# Patient Record
Sex: Male | Born: 1970 | Race: White | Hispanic: No | Marital: Single | Smoking: Current every day smoker
Health system: Southern US, Community
[De-identification: ages and names within clinical notes are randomized; demographics above are authoritative.]

## PROBLEM LIST (undated history)

## (undated) DIAGNOSIS — N200 Calculus of kidney: Secondary | ICD-10-CM

## (undated) HISTORY — PX: KNEE SURGERY: SHX244

---

## 2010-05-23 ENCOUNTER — Emergency Department (HOSPITAL_COMMUNITY): Admission: EM | Admit: 2010-05-23 | Discharge: 2010-05-23 | Payer: Self-pay | Admitting: Emergency Medicine

## 2010-05-31 ENCOUNTER — Emergency Department (HOSPITAL_COMMUNITY): Admission: EM | Admit: 2010-05-31 | Discharge: 2010-05-31 | Payer: Self-pay | Admitting: Emergency Medicine

## 2011-03-06 ENCOUNTER — Encounter: Payer: Self-pay | Admitting: *Deleted

## 2011-03-06 ENCOUNTER — Emergency Department (HOSPITAL_COMMUNITY)
Admission: EM | Admit: 2011-03-06 | Discharge: 2011-03-06 | Disposition: A | Discharge status: Home / Self Care | Payer: Self-pay | Attending: Emergency Medicine | Admitting: Emergency Medicine

## 2011-03-06 ENCOUNTER — Emergency Department (HOSPITAL_COMMUNITY): Payer: Self-pay

## 2011-03-06 DIAGNOSIS — F17200 Nicotine dependence, unspecified, uncomplicated: Secondary | ICD-10-CM | POA: Insufficient documentation

## 2011-03-06 DIAGNOSIS — S8010XA Contusion of unspecified lower leg, initial encounter: Secondary | ICD-10-CM | POA: Insufficient documentation

## 2011-03-06 DIAGNOSIS — M25579 Pain in unspecified ankle and joints of unspecified foot: Secondary | ICD-10-CM | POA: Insufficient documentation

## 2011-03-06 DIAGNOSIS — W64XXXA Exposure to other animate mechanical forces, initial encounter: Secondary | ICD-10-CM | POA: Insufficient documentation

## 2011-03-06 DIAGNOSIS — T148XXA Other injury of unspecified body region, initial encounter: Secondary | ICD-10-CM

## 2011-03-06 HISTORY — DX: Calculus of kidney: N20.0

## 2011-03-06 NOTE — ED Notes (Signed)
See triage note.

## 2011-03-06 NOTE — ED Notes (Signed)
MD at bedside. 

## 2011-03-06 NOTE — ED Notes (Signed)
Crutches fit appropriate for pt, pt able to return crutch walking without any problem
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
## 2011-03-15 NOTE — ED Provider Notes (Signed)
Medical screening examination/treatment/procedure(s) were performed by non-physician practitioner and as supervising physician I was immediately available for consultation/collaboration.   Benny Lennert, MD 03/15/11 1430

## 2013-12-25 ENCOUNTER — Encounter (HOSPITAL_COMMUNITY): Payer: Self-pay | Admitting: Emergency Medicine

## 2013-12-25 ENCOUNTER — Emergency Department (HOSPITAL_COMMUNITY): Payer: BC Managed Care – PPO

## 2013-12-25 ENCOUNTER — Emergency Department (HOSPITAL_COMMUNITY)
Admission: EM | Admit: 2013-12-25 | Discharge: 2013-12-25 | Disposition: A | Discharge status: Home / Self Care | Payer: BC Managed Care – PPO | Attending: Emergency Medicine | Admitting: Emergency Medicine

## 2013-12-25 DIAGNOSIS — W292XXA Contact with other powered household machinery, initial encounter: Secondary | ICD-10-CM | POA: Insufficient documentation

## 2013-12-25 DIAGNOSIS — F17200 Nicotine dependence, unspecified, uncomplicated: Secondary | ICD-10-CM | POA: Insufficient documentation

## 2013-12-25 DIAGNOSIS — S61419A Laceration without foreign body of unspecified hand, initial encounter: Secondary | ICD-10-CM

## 2013-12-25 DIAGNOSIS — Y9389 Activity, other specified: Secondary | ICD-10-CM | POA: Insufficient documentation

## 2013-12-25 DIAGNOSIS — Z87442 Personal history of urinary calculi: Secondary | ICD-10-CM | POA: Insufficient documentation

## 2013-12-25 DIAGNOSIS — S61209A Unspecified open wound of unspecified finger without damage to nail, initial encounter: Secondary | ICD-10-CM | POA: Insufficient documentation

## 2013-12-25 DIAGNOSIS — Y929 Unspecified place or not applicable: Secondary | ICD-10-CM | POA: Insufficient documentation

## 2013-12-25 MED ORDER — BACITRACIN-NEOMYCIN-POLYMYXIN 400-5-5000 EX OINT
TOPICAL_OINTMENT | Freq: Once | CUTANEOUS | Status: AC
  Administered 2013-12-25: 12:00:00 via TOPICAL
  Filled 2013-12-25: qty 1

## 2013-12-25 MED ORDER — POVIDONE-IODINE 10 % EX SOLN
CUTANEOUS | Status: AC
  Administered 2013-12-25: 11:00:00
  Filled 2013-12-25: qty 118

## 2013-12-25 MED ORDER — POVIDONE-IODINE 10 % EX SOLN
CUTANEOUS | Status: AC
  Filled 2013-12-25: qty 118

## 2013-12-25 MED ORDER — LIDOCAINE HCL (PF) 1 % IJ SOLN
INTRAMUSCULAR | Status: AC
  Filled 2013-12-25: qty 5

## 2013-12-25 MED ORDER — LIDOCAINE HCL (PF) 1 % IJ SOLN
5.0000 mL | Freq: Once | INTRAMUSCULAR | Status: AC
  Administered 2013-12-25: 12:00:00

## 2013-12-25 MED ORDER — CEPHALEXIN 500 MG PO CAPS: 500.0000 mg | ORAL_CAPSULE | Freq: Four times a day (QID) | ORAL | Status: DC

## 2013-12-25 NOTE — Discharge Instructions (Signed)
Return in one week for suture removal. Return sooner for redness, increased pain or signs of infection. Take the antibiotics as directed.

## 2013-12-25 NOTE — ED Notes (Signed)
Dressing applied to left hand with telfa, 4X4's, pt tolerated well,

## 2013-12-25 NOTE — ED Notes (Signed)
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

## 2013-12-25 NOTE — ED Provider Notes (Signed)
Medical screening examination/treatment/procedure(s) were performed by non-physician practitioner and as supervising physician I was immediately available for consultation/collaboration.  Flint MelterElliott L Daud Cayer, MD 12/25/13 1525

## 2013-12-25 NOTE — ED Notes (Signed)
Pt states he was cleaning grass from a lawn mower blade and cut his left ring finger on the blade

## 2013-12-25 NOTE — ED Notes (Signed)
Mixture of peroxide and NS used to soak pt's left hand,

## 2013-12-25 NOTE — ED Provider Notes (Signed)
CSN: [id number]     Arrival date & time 12/25/13  [id number] History   First MD Initiated Contact with Patient 12/25/13 1022     Chief Complaint  Patient presents with  . Extremity Laceration     (Consider location/radiation/quality/duration/timing/severity/associated sxs/prior Treatment) Patient is a 43 y.o. male presenting with skin laceration. The history is provided by the patient.  Laceration Location:  Finger Finger laceration location:  L ring finger Depth:  Through underlying tissue Quality: straight   Bleeding: controlled   Time since incident:  1 hour Laceration mechanism:  Metal edge Pain details:    Quality:  Dull   Severity:  Mild   Progression:  Unchanged Foreign body present:  No foreign bodies Relieved by:  Pressure Worsened by:  Nothing tried Tetanus status:  Up to date  Zada FindersJohn E Dorrance is a gastroenteritis male who presents to the ED with a laceration to his left ring finger. He was cleaning under his lawn mower and cut his finger on the blade. He denies any other injuries. Bleeding controlled.   Past Medical History  Diagnosis Date  . Kidney stones    Past Surgical History  Procedure Laterality Date  . Knee surgery     No family history on file. History  Substance Use Topics  . Smoking status: Current Every Day Smoker  . Smokeless tobacco: Not on file  . Alcohol Use: No    Review of Systems Negative except as stated in HPI   Allergies  Review of patient's allergies indicates no known allergies.  Home Medications   Prior to Admission medications   Not on File   BP 121/75  Pulse 88  Temp(Src) 97.2 F (36.2 C) (Oral)  Resp 18  Ht 6\' 2"  (1.88 m)  Wt 180 lb (81.647 kg)  BMI 23.10 kg/m2  SpO2 100% Physical Exam  Nursing note and vitals reviewed. Constitutional: He is oriented to person, place, and time. He appears well-developed and well-nourished. No distress.  HENT:  Head: Normocephalic.  Eyes: EOM are normal.  Neck: Neck supple.    Cardiovascular: Normal rate.   Pulmonary/Chest: Effort normal.  Musculoskeletal: Normal range of motion.       Left hand: He exhibits tenderness and laceration. He exhibits normal range of motion, no deformity and no swelling. Normal sensation noted. Decreased strength noted.       Hands: No tendon visualized. Good strength and range of motion, adequate circulation and good touch sensation.   Neurological: He is alert and oriented to person, place, and time. No cranial nerve deficit.  Skin: Skin is warm and dry.  Psychiatric: He has a normal mood and affect. His behavior is normal.    ED Course  Procedures  LACERATION REPAIR Performed by: Makayle Krahn Orlene OchM Cledith Kamiya Authorized by: Khali Perella Orlene OchM Talmage Teaster Consent: Verbal consent obtained. Risks and benefits: risks, benefits and alternatives were discussed Consent given by: patient Patient identity confirmed: provided demographic data Prepped and Draped in normal sterile fashion Wound explored  Laceration Location: left ring finger  Laceration Length: 2 cm  No Foreign Bodies seen or palpated  Anesthesia: local infiltration  Local anesthetic: lidocaine 1% without epinephrine  Anesthetic total: 3 ml  Irrigation method: syringe Amount of cleaning: standard  Skin closure: 5-0 prolene  Number of sutures: 5  Technique: interrupted  Patient tolerance: Patient tolerated the procedure well with no immediate complications.  Bacitracin ointment and dressing MDM  43 y.o. male with laceration to the left ring finger. Stable for discharge without neuro  deficits. Discussed with the patient plan of care and all questioned fully answered. He will follow up in 7 days for suture removal or sooner for any problems.    Medication List         cephALEXin 500 MG capsule  Commonly known as:  KEFLEX  Take 1 capsule (500 mg total) by mouth 4 (four) times daily.          CabanHope M Charistopher Rumble, [place]NP 12/25/13 1209

## 2013-12-25 NOTE — ED Notes (Signed)
Hope NP at bedside suturing at present time,

## 2015-11-09 ENCOUNTER — Emergency Department (HOSPITAL_COMMUNITY)
Admission: EM | Admit: 2015-11-09 | Discharge: 2015-11-10 | Disposition: A | Discharge status: Home / Self Care | Payer: BLUE CROSS/BLUE SHIELD | Attending: Emergency Medicine | Admitting: Emergency Medicine

## 2015-11-09 ENCOUNTER — Emergency Department (HOSPITAL_COMMUNITY): Payer: BLUE CROSS/BLUE SHIELD

## 2015-11-09 ENCOUNTER — Encounter (HOSPITAL_COMMUNITY): Payer: Self-pay

## 2015-11-09 DIAGNOSIS — Y9389 Activity, other specified: Secondary | ICD-10-CM | POA: Insufficient documentation

## 2015-11-09 DIAGNOSIS — F17200 Nicotine dependence, unspecified, uncomplicated: Secondary | ICD-10-CM | POA: Insufficient documentation

## 2015-11-09 DIAGNOSIS — S52514A Nondisplaced fracture of right radial styloid process, initial encounter for closed fracture: Secondary | ICD-10-CM | POA: Diagnosis not present

## 2015-11-09 DIAGNOSIS — Y999 Unspecified external cause status: Secondary | ICD-10-CM | POA: Insufficient documentation

## 2015-11-09 DIAGNOSIS — S52501A Unspecified fracture of the lower end of right radius, initial encounter for closed fracture: Secondary | ICD-10-CM

## 2015-11-09 DIAGNOSIS — S6991XA Unspecified injury of right wrist, hand and finger(s), initial encounter: Secondary | ICD-10-CM | POA: Diagnosis present

## 2015-11-09 DIAGNOSIS — W228XXA Striking against or struck by other objects, initial encounter: Secondary | ICD-10-CM | POA: Diagnosis not present

## 2015-11-09 DIAGNOSIS — Y929 Unspecified place or not applicable: Secondary | ICD-10-CM | POA: Diagnosis not present

## 2015-11-09 MED ORDER — OXYCODONE-ACETAMINOPHEN 5-325 MG PO TABS: 2.0000 | ORAL_TABLET | ORAL | Status: DC | PRN

## 2015-11-09 MED ORDER — ACETAMINOPHEN 325 MG PO TABS
650.0000 mg | ORAL_TABLET | Freq: Once | ORAL | Status: AC
  Administered 2015-11-09: 650 mg via ORAL
  Filled 2015-11-09: qty 2

## 2015-11-09 MED ORDER — IBUPROFEN 800 MG PO TABS
800.0000 mg | ORAL_TABLET | Freq: Once | ORAL | Status: AC
  Administered 2015-11-09: 800 mg via ORAL
  Filled 2015-11-09: qty 1

## 2015-11-09 MED ORDER — OXYCODONE-ACETAMINOPHEN 5-325 MG PO TABS: 1.0000 | ORAL_TABLET | Freq: Four times a day (QID) | ORAL | Status: DC | PRN
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
## 2015-11-09 NOTE — Discharge Instructions (Signed)
You have a fracture of the right radius (wrist). Please see Dr Mina Marble as soon as possible for management.Keep your wrist elevated as much as possible and apply ice. Radial Fracture A radial fracture is a break in the radius bone, which is the long bone of the forearm that is on the same side as your thumb. Your forearm is the part of your arm that is between your elbow and your wrist. It is made up of two bones: the radius and the ulna. Most radial fractures occur near the wrist (distal radialfracture) or near the elbow (radial head fracture). A distal radial fracture is the most common type of broken arm. This fracture usually occurs about an inch above the wrist. Fractures of the middle part of the bone are less common. CAUSES  Falling with your arm outstretched is the most common cause of a radial fracture. Other causes include:  Car accidents.  Bike accidents.  A direct blow to the middle part of the radius. RISK FACTORS  You may be at greater risk for a distal radial fracture if you are 50 years of age or older.  You may be at greater risk for a radial head fracture if you are:  Male.  36-55 years old.  You may be at a greater risk for all types of radial fractures if you have a condition that causes your bones to be weak or thin (osteoporosis). SIGNS AND SYMPTOMS A radial fracture causes pain immediately after the injury. Other signs and symptoms include:  An abnormal bend or bump in your arm (deformity).  Swelling.  Bruising.  Numbness or tingling.  Tenderness.  Limited movement. DIAGNOSIS  Your health care provider may diagnose a radial fracture based on:  Your symptoms.  Your medical history, including any recent injury.  A physical exam. Your health care provider will look for any deformity and feel for tenderness over the break. Your health care provider will also check whether the bone is out of place.  An X-ray exam to confirm the diagnosis and learn  more about the type of fracture. TREATMENT The goals of treatment are to get the bone in proper position for healing and to keep it from moving so it will heal over time. Your treatment will depend on many factors, especially the type of fracture that you have.  If the fractured bone:  Is in the correct position (nondisplaced), you may only need to wear a cast or a splint.  Has a slightly displaced fracture, you may need to have the bones moved back into place manually (closed reduction) before the splint or cast is put on.  You may have a temporary splint before you have a plaster cast. The splint allows room for some swelling. After a few days, a cast can replace the splint.  You may have to wear the cast for about 6 weeks or as directed by your health care provider.  The cast may be changed after about 3 weeks or as directed by your health care provider.  After your cast is taken off, you may need physical therapy to regain full movement in your wrist or elbow.  You may need emergency surgery if you have:  A fractured bone that is out of position (displaced).  A fracture with multiple fragments (comminuted fracture).  A fracture that breaks the skin (open fracture). This type of fracture may require surgical wires, plates, or screws to hold the bone in place.  You may have X-rays every  couple of weeks to check on your healing. HOME CARE INSTRUCTIONS  Keep the injured arm above the level of your heart while you are sitting or lying down. This helps to reduce swelling and pain.  Apply ice to the injured area:  Put ice in a plastic bag.  Place a towel between your skin and the bag.  Leave the ice on for 20 minutes, 2-3 times per day.  Move your fingers often to avoid stiffness and to minimize swelling.  If you have a plaster or fiberglass cast:  Do not try to scratch the skin under the cast using sharp or pointed objects.  Check the skin around the cast every day. You may  put lotion on any red or sore areas.  Keep your cast dry and clean.  If you have a plaster splint:  Wear the splint as directed.  Loosen the elastic around the splint if your fingers become numb and tingle, or if they turn cold and blue.  Do not put pressure on any part of your cast until it is fully hardened. Rest your cast only on a pillow for the first 24 hours.  Protect your cast or splint while bathing or showering, as directed by your health care provider. Do not put your cast or splint into water.  Take medicines only as directed by your health care provider.  Return to activities, such as sports, as directed by your health care provider. Ask your health care provider what activities are safe for you.  Keep all follow-up visits as directed by your health care provider. This is important. SEEK MEDICAL CARE IF:  Your pain medicine is not helping.  Your cast gets damaged or it breaks.  Your cast becomes loose.  Your cast gets wet.  You have more severe pain or swelling than you did before the cast.  You have severe pain when stretching your fingers.  You continue to have pain or stiffness in your elbow or your wrist after your cast is taken off. SEEK IMMEDIATE MEDICAL CARE IF:  You cannot move your fingers.  You lose feeling in your fingers or your hand.  Your hand or your fingers turn cold and pale or blue.  You notice a bad smell coming from your cast.  You have drainage from underneath your cast.  You have new stains from blood or drainage seeping through your cast.   This information is not intended to replace advice given to you by your health care provider. Make sure you discuss any questions you have with your health care provider.   Document Released: 01/16/2006 Document Revised: 08/26/2014 Document Reviewed: 01/28/2014 Elsevier Interactive Patient Education [employer]  elevated as much as possible.

## 2015-11-09 NOTE — ED Notes (Signed)
I had a tire blow up per pt.  Deformity noted to right forearm. Patient is able to move his right wrist, right hand, and fingers.

## 2015-11-16 MED FILL — Oxycodone w/ Acetaminophen Tab 5-325 MG: ORAL | Qty: 6 | Status: AC

## 2016-04-27 IMAGING — DX DG WRIST COMPLETE 3+V*R*
4 series · 4 of 4 positions shown · non-contrast
Comparison: None.

CLINICAL DATA: Right radial wrist pain and swelling after injury.

EXAM:
RIGHT WRIST - COMPLETE 3+ VIEW

[wrist pa]
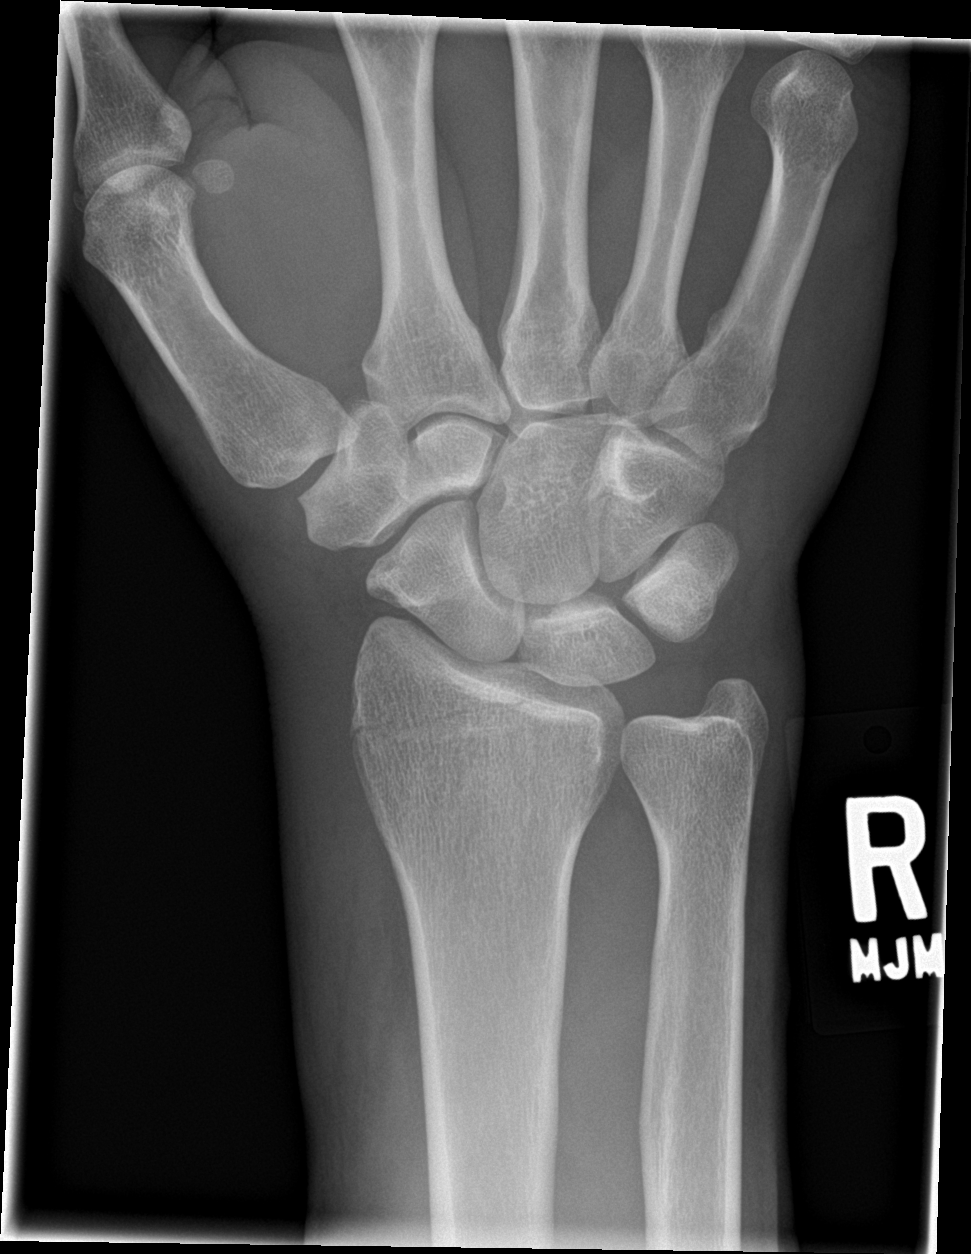

[wrist obl]
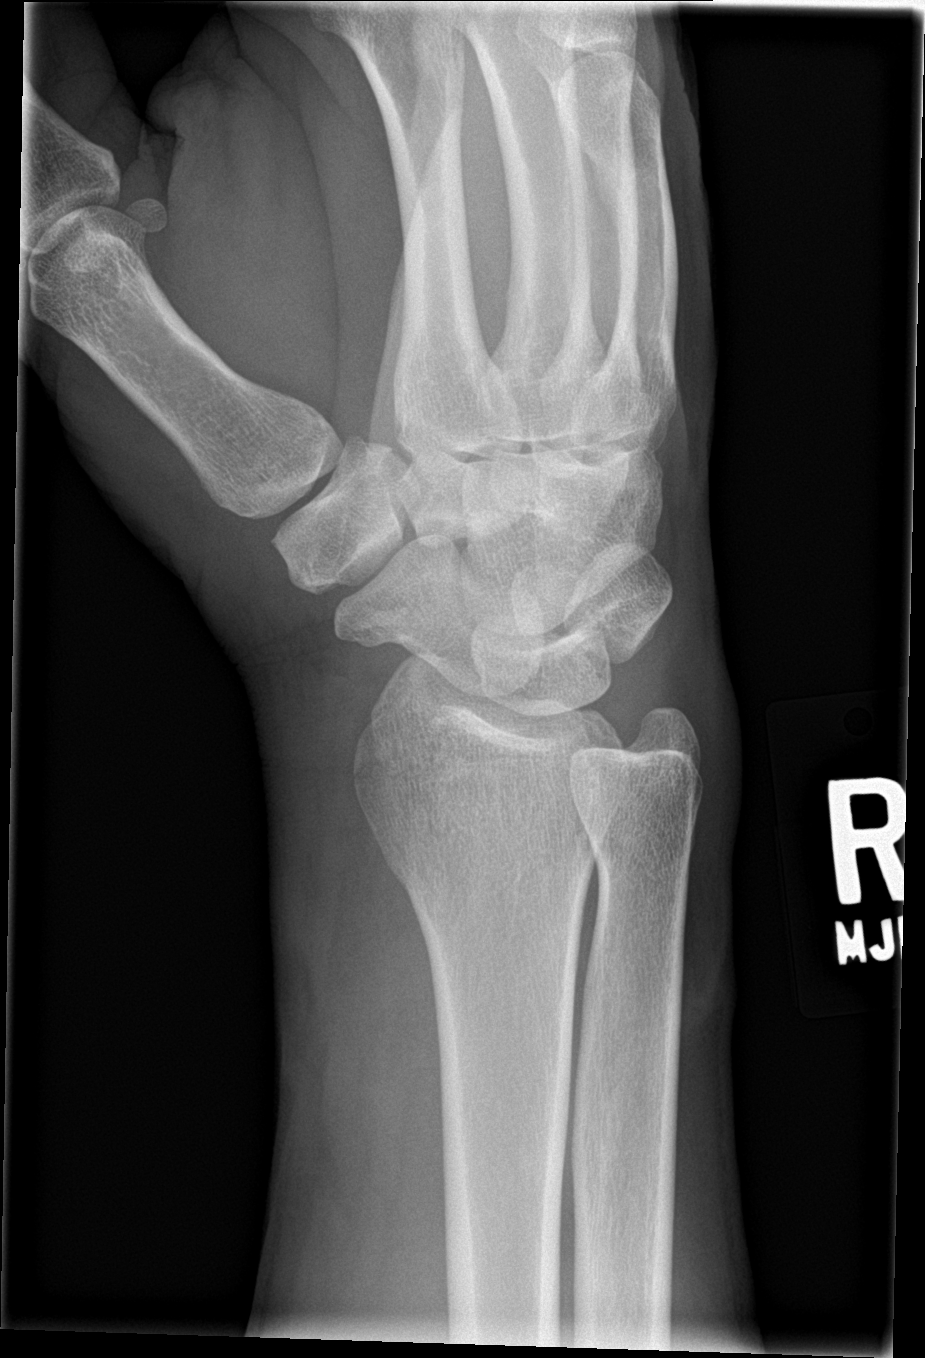

[wrist lat]
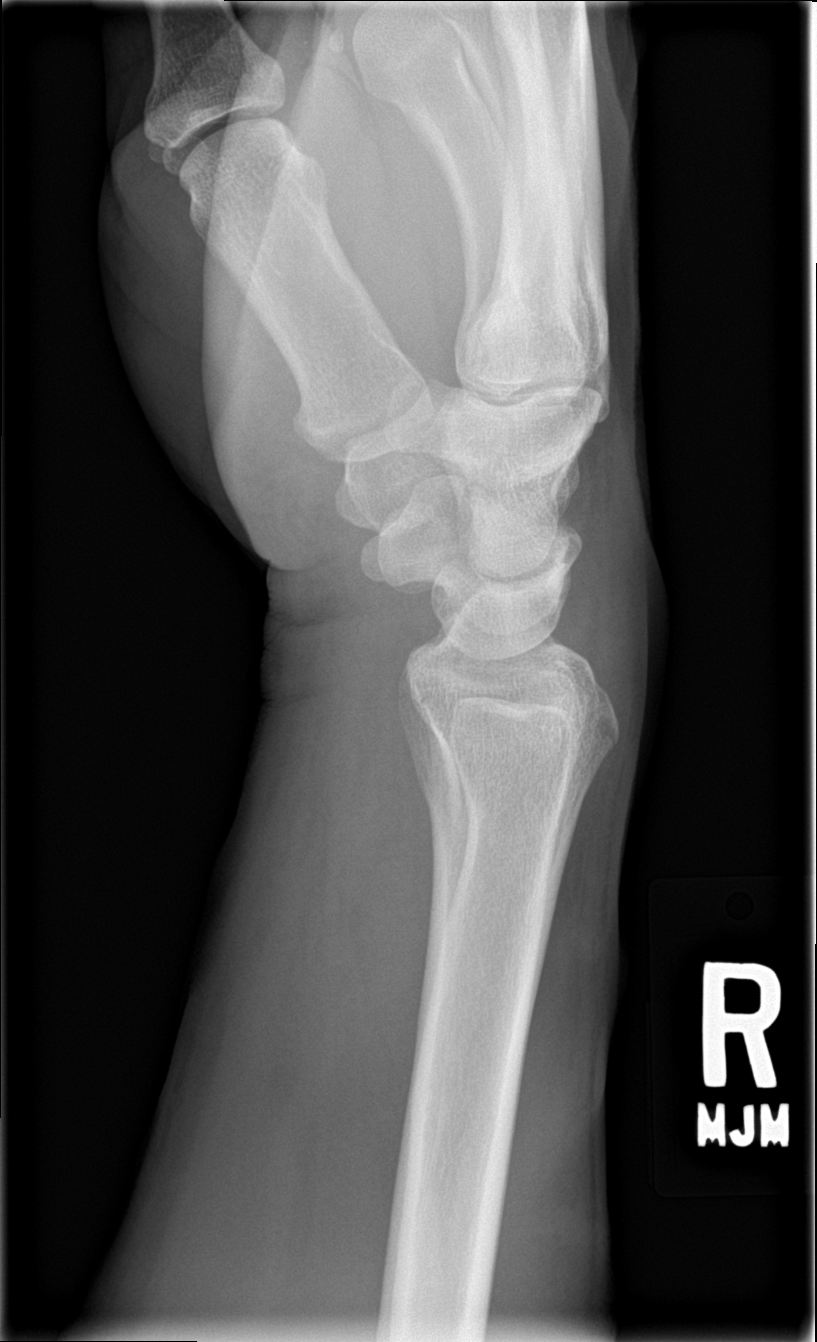

[wrist navicular]
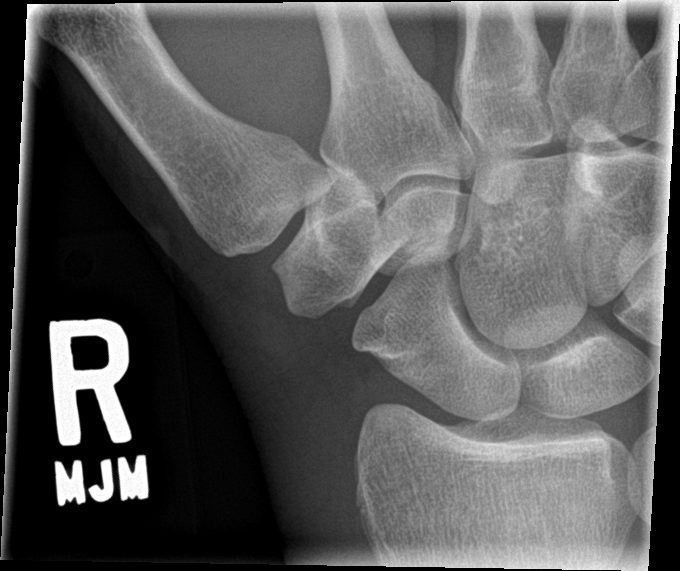

[4 of 4 positions shown; findings below may reference images not displayed]

FINDINGS: Transverse linear lucency through the distal right radius at the
base of the radial styloid process. This indicates a nondisplaced
fracture. Fracture line extends to the radiocarpal joint. No
additional fractures identified. Dorsal soft tissue swelling over
the right wrist.
IMPRESSION: Oblique linear lucency across the base of the right radial styloid
process suggesting nondisplaced acute fracture.

## 2016-07-15 ENCOUNTER — Encounter (HOSPITAL_COMMUNITY): Payer: Self-pay | Admitting: *Deleted

## 2016-07-15 ENCOUNTER — Emergency Department (HOSPITAL_COMMUNITY)
Admission: EM | Admit: 2016-07-15 | Discharge: 2016-07-15 | Payer: BLUE CROSS/BLUE SHIELD | Attending: Emergency Medicine | Admitting: Emergency Medicine

## 2016-07-15 DIAGNOSIS — F17200 Nicotine dependence, unspecified, uncomplicated: Secondary | ICD-10-CM | POA: Insufficient documentation

## 2016-07-15 DIAGNOSIS — Y929 Unspecified place or not applicable: Secondary | ICD-10-CM | POA: Insufficient documentation

## 2016-07-15 DIAGNOSIS — Y999 Unspecified external cause status: Secondary | ICD-10-CM | POA: Insufficient documentation

## 2016-07-15 DIAGNOSIS — Y939 Activity, unspecified: Secondary | ICD-10-CM | POA: Insufficient documentation

## 2016-07-15 DIAGNOSIS — T2020XA Burn of second degree of head, face, and neck, unspecified site, initial encounter: Secondary | ICD-10-CM | POA: Insufficient documentation

## 2016-07-15 DIAGNOSIS — X04XXXA Exposure to ignition of highly flammable material, initial encounter: Secondary | ICD-10-CM | POA: Insufficient documentation

## 2016-07-15 MED ORDER — HYDROMORPHONE HCL 1 MG/ML IJ SOLN
INTRAMUSCULAR | Status: AC
  Administered 2016-07-15: 1 mg via INTRAVENOUS
  Filled 2016-07-15: qty 1

## 2016-07-15 MED ORDER — HYDROMORPHONE HCL 1 MG/ML IJ SOLN
1.0000 mg | Freq: Once | INTRAMUSCULAR | Status: AC
  Administered 2016-07-15: 1 mg via INTRAVENOUS

## 2016-07-15 MED ORDER — SODIUM CHLORIDE 0.9 % IV BOLUS (SEPSIS)
1000.0000 mL | Freq: Once | INTRAVENOUS | Status: AC
Start: 2016-07-15 — End: 2016-07-15
  Administered 2016-07-15: 1000 mL via INTRAVENOUS

## 2016-07-15 MED ORDER — HYDROMORPHONE HCL 1 MG/ML IJ SOLN
1.0000 mg | Freq: Once | INTRAMUSCULAR | Status: AC
  Administered 2016-07-15: 1 mg via INTRAVENOUS
  Filled 2016-07-15: qty 1
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
## 2018-08-03 DIAGNOSIS — Z139 Encounter for screening, unspecified: Secondary | ICD-10-CM

## 2018-08-03 LAB — GLUCOSE, POCT (MANUAL RESULT ENTRY): POC Glucose: 99 mg/dl (ref 70–99)

## 2018-08-03 NOTE — Congregational Nurse Program (Signed)
  Dept: [phone]   Congregational Nurse Program Note  Date of Encounter: 08/03/2018  Past Medical History: Past Medical History:  Diagnosis Date  . Kidney stones     Encounter Details: CNP Questionnaire - 08/03/18 1425      Questionnaire   Patient Status  Not Applicable    Race  White or Caucasian    [facility] Patient [facility]    Insurance  Not Applicable    Uninsured  Uninsured (NEW 1x/quarter)    Food  Within past 12 months, worried food would run out with no money to buy more;Yes, have food insecurities    Housing/Utilities  Yes, have permanent housing    Transportation  No transportation needs    Interpersonal Safety  Yes, feel physically and emotionally safe where you currently live    Medication  No medication insecurities    Medical Provider  No    Referrals  [facility];Primary Care Provider/Clinic    ED Visit Averted  Not Applicable    Life-Saving Intervention Made  Not Applicable       Pt present to clinic with c/o lower back and leg pain with numbness.  States he has seen an urgent care provider (Dr. Margo AyeHall) on 07/27/18 and was prescribed back pain meds, but was encouraged to get an medical provider to address on going treatment and medical care.  States he has been dealing with pain since the summer of 2019  Vitals checked and all WNL; Pt described being very concerned about thinking he needs an MRI to address his ongoing back issued; Addressed wanting to quit smoking.   Educated and gave handouts on how to stay healthy and ways to stop smoking  Referred to RN Case Manager, Norval GablePatricia Gilley, to obtain appointment for PCP

## 2018-08-05 ENCOUNTER — Ambulatory Visit: Payer: Self-pay | Admitting: Physician Assistant

## 2018-08-05 ENCOUNTER — Encounter: Payer: Self-pay | Admitting: Physician Assistant

## 2018-08-05 VITALS — BP 116/70 | HR 69 | Temp 97.9°F | Ht 72.5 in | Wt 166.0 lb

## 2018-08-05 DIAGNOSIS — Z125 Encounter for screening for malignant neoplasm of prostate: Secondary | ICD-10-CM

## 2018-08-05 DIAGNOSIS — G8929 Other chronic pain: Secondary | ICD-10-CM

## 2018-08-05 DIAGNOSIS — M5442 Lumbago with sciatica, left side: Secondary | ICD-10-CM

## 2018-08-05 DIAGNOSIS — Z13220 Encounter for screening for lipoid disorders: Secondary | ICD-10-CM

## 2018-08-05 DIAGNOSIS — Z7689 Persons encountering health services in other specified circumstances: Secondary | ICD-10-CM

## 2018-08-05 DIAGNOSIS — F17200 Nicotine dependence, unspecified, uncomplicated: Secondary | ICD-10-CM

## 2018-08-05 MED ORDER — PREDNISONE 10 MG PO TABS: ORAL_TABLET | ORAL | 0 refills | Status: DC

## 2018-08-05 NOTE — Patient Instructions (Addendum)
Prednisone tablets What is this medicine? PREDNISONE (PRED ni sone) is a corticosteroid. It is commonly used to treat inflammation of the skin, joints, lungs, and other organs. Common conditions treated include asthma, allergies, and arthritis. It is also used for other conditions, such as blood disorders and diseases of the adrenal glands. This medicine may be used for other purposes; ask your health care provider or pharmacist if you have questions. COMMON BRAND NAME(S): Deltasone, Predone, Sterapred, Sterapred DS What should I tell my health care provider before I take this medicine? They need to know if you have any of these conditions: -Cushing's syndrome -diabetes -glaucoma -heart disease -high blood pressure -infection (especially a virus infection such as chickenpox, cold sores, or herpes) -kidney disease -liver disease -mental illness -myasthenia gravis -osteoporosis -seizures -stomach or intestine problems -thyroid disease -an unusual or allergic reaction to lactose, prednisone, other medicines, foods, dyes, or preservatives -pregnant or trying to get pregnant -breast-feeding How should I use this medicine? Take this medicine by mouth with a glass of water. Follow the directions on the prescription label. Take this medicine with food. If you are taking this medicine once a day, take it in the morning. Do not take more medicine than you are told to take. Do not suddenly stop taking your medicine because you may develop a severe reaction. Your doctor will tell you how much medicine to take. If your doctor wants you to stop the medicine, the dose may be slowly lowered over time to avoid any side effects. Talk to your pediatrician regarding the use of this medicine in children. Special care may be needed. Overdosage: If you think you have taken too much of this medicine contact a poison control center or emergency room at once. NOTE: This medicine is only for you. Do not share this  medicine with others. What if I miss a dose? If you miss a dose, take it as soon as you can. If it is almost time for your next dose, talk to your doctor or health care professional. You may need to miss a dose or take an extra dose. Do not take double or extra doses without advice. What may interact with this medicine? Do not take this medicine with any of the following medications: -metyrapone -mifepristone This medicine may also interact with the following medications: -aminoglutethimide -amphotericin B -aspirin and aspirin-like medicines -barbiturates -certain medicines for diabetes, like glipizide or glyburide -cholestyramine -cholinesterase inhibitors -cyclosporine -digoxin -diuretics -ephedrine -male hormones, like estrogens and birth control pills -isoniazid -ketoconazole -NSAIDS, medicines for pain and inflammation, like ibuprofen or naproxen -phenytoin -rifampin -toxoids -vaccines -warfarin This list may not describe all possible interactions. Give your health care provider a list of all the medicines, herbs, non-prescription drugs, or dietary supplements you use. Also tell them if you smoke, drink alcohol, or use illegal drugs. Some items may interact with your medicine. What should I watch for while using this medicine? Visit your doctor or health care professional for regular checks on your progress. If you are taking this medicine over a prolonged period, carry an identification card with your name and address, the type and dose of your medicine, and your doctor's name and address. This medicine may increase your risk of getting an infection. Tell your doctor or health care professional if you are around anyone with measles or chickenpox, or if you develop sores or blisters that do not heal properly. If you are going to have surgery, tell your doctor or health care professional that  you have taken this medicine within the last twelve months. Ask your doctor or health  care professional about your diet. You may need to lower the amount of salt you eat. This medicine may increase blood sugar. Ask your healthcare provider if changes in diet or medicines are needed if you have diabetes. What side effects may I notice from receiving this medicine? Side effects that you should report to your doctor or health care professional as soon as possible: -allergic reactions like skin rash, itching or hives, swelling of the face, lips, or tongue -changes in emotions or moods -changes in vision -depressed mood -eye pain -fever or chills, cough, sore throat, pain or difficulty passing urine -signs and symptoms of high blood sugar such as being more thirsty or hungry or having to urinate more than normal. You may also feel very tired or have blurry vision. -swelling of ankles, feet Side effects that usually do not require medical attention (report to your doctor or health care professional if they continue or are bothersome): -confusion, excitement, restlessness -headache -nausea, vomiting -skin problems, acne, thin and shiny skin -trouble sleeping -weight gain This list may not describe all possible side effects. Call your doctor for medical advice about side effects. You may report side effects to FDA at 1-800-FDA-1088. Where should I keep my medicine? Keep out of the reach of children. Store at room temperature between 15 and 30 degrees C (59 and 86 degrees F). Protect from light. Keep container tightly closed. Throw away any unused medicine after the expiration date. NOTE: This sheet is a summary. It may not cover all possible information. If you have questions about this medicine, talk to your doctor, pharmacist, or health care provider.  2019 Elsevier/Gold Standard (2018-05-05 10:54:22)   -----------------------------------------------------------  Financial Counselor- [phone]
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
## 2018-08-06 ENCOUNTER — Ambulatory Visit (HOSPITAL_COMMUNITY)
Admission: RE | Admit: 2018-08-06 | Discharge: 2018-08-06 | Disposition: A | Discharge status: Home / Self Care | Payer: Self-pay | Source: Ambulatory Visit | Attending: Physician Assistant | Admitting: Physician Assistant

## 2018-08-06 ENCOUNTER — Other Ambulatory Visit (HOSPITAL_COMMUNITY)
Admission: RE | Admit: 2018-08-06 | Discharge: 2018-08-06 | Disposition: A | Discharge status: Home / Self Care | Payer: Self-pay | Source: Ambulatory Visit | Attending: Physician Assistant | Admitting: Physician Assistant

## 2018-08-06 DIAGNOSIS — G8929 Other chronic pain: Secondary | ICD-10-CM | POA: Insufficient documentation

## 2018-08-06 DIAGNOSIS — M5442 Lumbago with sciatica, left side: Secondary | ICD-10-CM | POA: Insufficient documentation

## 2018-08-06 DIAGNOSIS — Z125 Encounter for screening for malignant neoplasm of prostate: Secondary | ICD-10-CM

## 2018-08-06 DIAGNOSIS — Z13220 Encounter for screening for lipoid disorders: Secondary | ICD-10-CM

## 2018-08-06 LAB — COMPREHENSIVE METABOLIC PANEL
ALT: 15 U/L (ref 0–44)
AST: 15 U/L (ref 15–41)
Albumin: 4.3 g/dL (ref 3.5–5.0)
Alkaline Phosphatase: 47 U/L (ref 38–126)
Anion gap: 5 (ref 5–15)
BILIRUBIN TOTAL: 0.5 mg/dL (ref 0.3–1.2)
BUN: 23 mg/dL — ABNORMAL HIGH (ref 6–20)
CO2: 26 mmol/L (ref 22–32)
Calcium: 9.8 mg/dL (ref 8.9–10.3)
Chloride: 105 mmol/L (ref 98–111)
Creatinine, Ser: 0.92 mg/dL (ref 0.61–1.24)
GFR calc Af Amer: >60 mL/min (ref >60)
GFR calc non Af Amer: >60 mL/min (ref >60)
Glucose, Bld: 106 mg/dL — ABNORMAL HIGH (ref 70–99)
Potassium: 4.3 mmol/L (ref 3.5–5.1)
Sodium: 136 mmol/L (ref 135–145)
Total Protein: 7.9 g/dL (ref 6.5–8.1)

## 2018-08-06 LAB — LIPID PANEL
Cholesterol: 158 mg/dL (ref 0–200)
HDL: 51 mg/dL (ref >40)
LDL Cholesterol: 98 mg/dL (ref 0–99)
Total CHOL/HDL Ratio: 3.1 RATIO
Triglycerides: 46 mg/dL (ref <150)
VLDL: 9 mg/dL (ref 0–40)

## 2018-08-06 LAB — PSA: Prostatic Specific Antigen: 0.83 ng/mL (ref 0.00–4.00)

## 2018-09-03 ENCOUNTER — Ambulatory Visit: Payer: Self-pay | Admitting: Physician Assistant

## 2018-09-03 ENCOUNTER — Encounter: Payer: Self-pay | Admitting: Physician Assistant

## 2018-09-03 VITALS — BP 96/61 | HR 83 | Temp 97.7°F | Ht 72.5 in | Wt 169.0 lb

## 2018-09-03 DIAGNOSIS — M5442 Lumbago with sciatica, left side: Principal | ICD-10-CM

## 2018-09-03 DIAGNOSIS — G8929 Other chronic pain: Secondary | ICD-10-CM

## 2018-09-03 DIAGNOSIS — F17200 Nicotine dependence, unspecified, uncomplicated: Secondary | ICD-10-CM

## 2018-09-03 NOTE — Progress Notes (Signed)
BP 96/61 (BP Location: Right Arm, Patient Position: Sitting, Cuff Size: Normal)   Pulse 83   Temp 97.7 F (36.5 C) (Other (Comment))   Ht 6' 0.5" (1.842 m)   Wt 169 lb (76.7 kg)   SpO2 98%   BMI 22.61 kg/m    Subjective:    Patient ID: Seth Carney, male    DOB: September 02, 1970, 48 y.o.   MRN: [id number]  HPI: Seth Carney is a 48 y.o. male presenting on 09/03/2018 for Follow-up   HPI   Pt has not yet turned in his [facility] application.  He is planning to do that today.   He had xray in December and needs referral to orthopedics.  He has had LBP with radiculopathy going down the LLE for months.  He has been treated with steroids which helped some but did not provide persistent relief.   Pt says he is doing well other than his back pain.   Relevant past medical, surgical, family and social history reviewed and updated as indicated. Interim medical history since our last visit reviewed. Allergies and medications reviewed and updated.  No current outpatient medications on file.    Review of Systems  Constitutional: Negative for appetite change, chills, diaphoresis, fatigue, fever and unexpected weight change.  HENT: Positive for dental problem. Negative for congestion, drooling, ear pain, facial swelling, hearing loss, mouth sores, sneezing, sore throat, trouble swallowing and voice change.   Eyes: Negative for pain, discharge, redness, itching and visual disturbance.  Respiratory: Negative for cough, choking, shortness of breath and wheezing.   Cardiovascular: Negative for chest pain, palpitations and leg swelling.  Gastrointestinal: Negative for abdominal pain, blood in stool, constipation, diarrhea and vomiting.  Endocrine: Negative for cold intolerance, heat intolerance and polydipsia.  Genitourinary: Negative for decreased urine volume, dysuria and hematuria.  Musculoskeletal: Positive for arthralgias, back pain and gait problem.  Skin: Negative for rash.   Allergic/Immunologic: Negative for environmental allergies.  Neurological: Negative for seizures, syncope, light-headedness and headaches.  Hematological: Negative for adenopathy.  Psychiatric/Behavioral: Negative for agitation, dysphoric mood and suicidal ideas. The patient is not nervous/anxious.     Per HPI unless specifically indicated above     Objective:    BP 96/61 (BP Location: Right Arm, Patient Position: Sitting, Cuff Size: Normal)   Pulse 83   Temp 97.7 F (36.5 C) (Other (Comment))   Ht 6' 0.5" (1.842 m)   Wt 169 lb (76.7 kg)   SpO2 98%   BMI 22.61 kg/m   Wt Readings from Last 3 Encounters:  09/03/18 169 lb (76.7 kg)  08/05/18 166 lb (75.3 kg)  08/03/18 167 lb (75.8 kg)    Physical Exam Vitals signs reviewed.  Constitutional:      Appearance: He is well-developed.  HENT:     Head: Normocephalic and atraumatic.  Neck:     Musculoskeletal: Neck supple.  Cardiovascular:     Rate and Rhythm: Normal rate and regular rhythm.  Pulmonary:     Effort: Pulmonary effort is normal.     Breath sounds: Normal breath sounds. No wheezing.  Abdominal:     General: Bowel sounds are normal.     Palpations: Abdomen is soft.     Tenderness: There is no abdominal tenderness.  Lymphadenopathy:     Cervical: No cervical adenopathy.  Skin:    General: Skin is warm and dry.  Neurological:     Mental Status: He is alert and oriented to person, place, and time.  Psychiatric:        Behavior: Behavior normal.     Results for orders placed or performed during the hospital encounter of 08/06/18  PSA  Result Value Ref Range   Prostatic Specific Antigen 0.83 0.00 - 4.00 ng/mL  Lipid panel  Result Value Ref Range   Cholesterol 158 0 - 200 mg/dL   Triglycerides 46 <903 mg/dL   HDL 51 >00 mg/dL   Total CHOL/HDL Ratio 3.1 RATIO   VLDL 9 0 - 40 mg/dL   LDL Cholesterol 98 0 - 99 mg/dL  Comprehensive metabolic panel  Result Value Ref Range   Sodium 136 135 - 145 mmol/L    Potassium 4.3 3.5 - 5.1 mmol/L   Chloride 105 98 - 111 mmol/L   CO2 26 22 - 32 mmol/L   Glucose, Bld 106 (H) 70 - 99 mg/dL   BUN 23 (H) 6 - 20 mg/dL   Creatinine, Ser 9.23 0.61 - 1.24 mg/dL   Calcium 9.8 8.9 - 30.0 mg/dL   Total Protein 7.9 6.5 - 8.1 g/dL   Albumin 4.3 3.5 - 5.0 g/dL   AST 15 15 - 41 U/L   ALT 15 0 - 44 U/L   Alkaline Phosphatase 47 38 - 126 U/L   Total Bilirubin 0.5 0.3 - 1.2 mg/dL   GFR calc non Af Amer >60 >60 mL/min   GFR calc Af Amer >60 >60 mL/min   Anion gap 5 5 - 15      Assessment & Plan:   Encounter Diagnoses  Name Primary?  . Chronic left-sided low back pain with left-sided sciatica Yes  . Tobacco use disorder     -reviewed labs with pt -counseled smoking cessation -encouraged pt to get [facility] submitted -Refer to orthopedics for back- in [place] -pt to follow up here in 1 year.  RTO sooner prn

## 2018-09-15 ENCOUNTER — Encounter: Payer: Self-pay | Admitting: Student

## 2018-10-28 ENCOUNTER — Ambulatory Visit (INDEPENDENT_AMBULATORY_CARE_PROVIDER_SITE_OTHER): Payer: Self-pay | Admitting: Orthopaedic Surgery

## 2018-11-04 ENCOUNTER — Encounter (INDEPENDENT_AMBULATORY_CARE_PROVIDER_SITE_OTHER): Payer: Self-pay | Admitting: Orthopaedic Surgery

## 2018-11-04 ENCOUNTER — Ambulatory Visit (INDEPENDENT_AMBULATORY_CARE_PROVIDER_SITE_OTHER): Payer: Self-pay | Admitting: Orthopaedic Surgery

## 2018-11-04 ENCOUNTER — Other Ambulatory Visit: Payer: Self-pay

## 2018-11-04 VITALS — BP 115/80 | HR 83 | Ht 74.0 in | Wt 165.0 lb

## 2018-11-04 DIAGNOSIS — G8929 Other chronic pain: Secondary | ICD-10-CM

## 2018-11-04 DIAGNOSIS — M5442 Lumbago with sciatica, left side: Secondary | ICD-10-CM

## 2018-11-04 NOTE — Progress Notes (Signed)
Office Visit Note   Patient: Seth Carney           Date of Birth: 1971/08/01           MRN: [id number] Visit Date: 11/04/2018              Requested by: Jacquelin Hawking, PA-C [id number] [place], [place] [address] PCP: Jacquelin Hawking, PA-C   Assessment & Plan: Visit Diagnoses:  1. Chronic left-sided low back pain with left-sided sciatica     Plan: Only experiencing nonradicular low back pain.  I have reviewed his x-rays that demonstrate some mild degenerative changes at L4-5 and L5S1.  He does not have any evidence of neurologic deficit.  After much discussion he like to try a course of physical therapy at [facility] and return to see me at anytime in the future.  He is actually doing fairly well at the moment.  He would like to get back into the some type of employment and discussed some of his potential limitations  Follow-Up Instructions: Return if symptoms worsen or fail to improve.   Orders:  No orders of the defined types were placed in this encounter.  No orders of the defined types were placed in this encounter.     Procedures: No procedures performed   Clinical Data: No additional findings.   Subjective: Chief Complaint  Patient presents with  . Lower Back - Pain  Patient presents with lower back pain X 5 months. He remembers bending over and having a sharp pain. His lower back has hurt since. He said that it radiates down his left side. He had numbness and tingling down his left leg until he was given a dose pak months ago.  He has been to three different hospitals and had x rays taken at them. He is not currently working. His most recent x-rays are in the [facility].  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  Some mild facet joint arthritis.  No listhesis 48 year old gentleman with at least a 94-month history of low back pain.  He relates that some of this pain may be related to on-the-job injury as he does a lot  of heavy lifting.  He no longer is working.  Initially he was having back pain and left lower extremity radiculopathy.  He was seen and treated with a Dosepak months ago with relief of his leg pain.  Now he has an occasional low back pain but it is "not constant".  He denies any bowel or bladder dysfunction.  There is a strong family history of problems in his family referable to the lumbar spine and his father, brothers and grandfather.  He does smoke.  Does use over-the-counter medicines when he needs something for pain  HPI  Review of Systems   Objective: Vital Signs: BP 115/80   Pulse 83   Ht 6\' 2"  (1.88 m)   Wt 165 lb (74.8 kg)   BMI 21.18 kg/m   Physical Exam Constitutional:      Appearance: He is well-developed.  Eyes:     Pupils: Pupils are equal, round, and reactive to light.  Pulmonary:     Effort: Pulmonary effort is normal.  Skin:    General: Skin is warm and dry.  Neurological:     Mental Status: He is alert and oriented to person, place, and time.  Psychiatric:        Behavior: Behavior normal.  Ortho Exam awake alert and oriented x3.  Comfortable sitting.  Straight leg raise was minimally positive on the left at about 90 degrees and negative on the right.  Reflexes are symmetrical.  Has had prior left knee surgery with prominence of the area of the tibial tubercle but no pain.  Neurologically intact distally.  Painless range of motion both hips.  No percussible tenderness of the lumbar spine  Specialty Comments:  No specialty comments available.  Imaging: No results found.   PMFS History: There are no active problems to display for this patient.  Past Medical History:  Diagnosis Date  . Kidney stones     Family History  Problem Relation Age of Onset  . COPD Mother   . Bipolar disorder Father   . Diabetes Father   . Cancer Brother     Past Surgical History:  Procedure Laterality Date  . KNEE SURGERY Left    x6   Social History    Occupational History  . Not on file  Tobacco Use  . Smoking status: Current Every Day Smoker    Packs/day: 2.00    Years: 31.00    Pack years: 62.00  . Smokeless tobacco: Never Used  Substance and Sexual Activity  . Alcohol use: No  . Drug use: No  . Sexual activity: Not on file

## 2018-11-04 NOTE — Addendum Note (Signed)
Addended by: Wendi Maya on: 11/04/2018 02:13 PM   Modules accepted: Orders

## 2019-02-15 ENCOUNTER — Telehealth: Payer: Self-pay | Admitting: Physician Assistant

## 2019-09-02 ENCOUNTER — Ambulatory Visit: Payer: Self-pay | Admitting: Physician Assistant

## 2019-09-02 ENCOUNTER — Other Ambulatory Visit: Payer: Self-pay

## 2019-09-02 ENCOUNTER — Encounter: Payer: Self-pay | Admitting: Physician Assistant

## 2019-09-02 ENCOUNTER — Other Ambulatory Visit (HOSPITAL_COMMUNITY)
Admission: RE | Admit: 2019-09-02 | Discharge: 2019-09-02 | Disposition: A | Discharge status: Home / Self Care | Payer: Self-pay | Source: Ambulatory Visit | Attending: Physician Assistant | Admitting: Physician Assistant

## 2019-09-02 VITALS — BP 112/70 | HR 90 | Temp 97.5°F | Wt 177.5 lb

## 2019-09-02 DIAGNOSIS — F17200 Nicotine dependence, unspecified, uncomplicated: Secondary | ICD-10-CM

## 2019-09-02 DIAGNOSIS — Z125 Encounter for screening for malignant neoplasm of prostate: Secondary | ICD-10-CM

## 2019-09-02 DIAGNOSIS — Z0000 Encounter for general adult medical examination without abnormal findings: Secondary | ICD-10-CM

## 2019-09-02 DIAGNOSIS — Z1211 Encounter for screening for malignant neoplasm of colon: Secondary | ICD-10-CM

## 2019-09-02 LAB — PSA: Prostatic Specific Antigen: 0.8 ng/mL (ref 0.00–4.00)

## 2019-09-02 NOTE — Progress Notes (Signed)
BP 112/70   Pulse 90   Temp (!) 97.5 F (36.4 C)   Wt 177 lb 8 oz (80.5 kg)   SpO2 98%   BMI 22.79 kg/m    Subjective:    Patient ID: Seth Carney, male    DOB: [birth year], 49 y.o.   MRN: [id number]  HPI: Seth Carney is a 49 y.o. 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   He says otherwise he is doing well.     Relevant past medical, surgical, family and social history reviewed and updated as indicated. Interim medical history since our last visit reviewed. Allergies and medications reviewed and updated.   Current Outpatient Medications:  .  ibuprofen (ADVIL) 200 MG tablet, Take 200 mg by mouth every 6 (six) hours as needed., Disp: , Rfl:  .  OVER THE COUNTER MEDICATION, Take 1 tablet by mouth every morning. OTC med for cough, Disp: , Rfl:     Review of Systems  Per HPI unless specifically indicated above     Objective:    BP 112/70   Pulse 90   Temp (!) 97.5 F (36.4 C)   Wt 177 lb 8 oz (80.5 kg)   SpO2 98%   BMI 22.79 kg/m   Wt Readings from Last 3 Encounters:  09/02/19 177 lb 8 oz (80.5 kg)  11/04/18 165 lb (74.8 kg)  09/03/18 169 lb (76.7 kg)    Physical Exam Vitals reviewed.  Constitutional:      General: He is not in acute distress.    Appearance: Normal appearance. He is well-developed. He is not ill-appearing.  HENT:     Head: Normocephalic and atraumatic.  Cardiovascular:     Rate and Rhythm: Normal rate and regular rhythm.  Pulmonary:     Effort: Pulmonary effort is normal.     Breath sounds: Normal breath sounds. No wheezing.  Abdominal:     General: Bowel sounds are normal.     Palpations: Abdomen is soft.   Tenderness: There is no abdominal tenderness.  Musculoskeletal:     Cervical back: Neck supple.     Right lower leg: No edema.     Left lower leg: No edema.  Lymphadenopathy:     Cervical: No cervical adenopathy.  Skin:    General: Skin is warm and dry.  Neurological:     Mental Status: He is alert and oriented to person, place, and time.  Psychiatric:        Attention and Perception: Attention normal.        Speech: Speech normal.        Behavior: Behavior normal. Behavior is cooperative.           Assessment & Plan:    Encounter Diagnoses  Name Primary?  . Encounter for annual physical exam Yes  . Tobacco use disorder   . Screening for prostate cancer   . Screening for colon cancer      -Pt is doing well.  He is counseled to use heat/ice on back as needed and can also use IBU and/or APAP prn. -Will update psa -pt is given ifobt for  colon cancer screening -counseled smoking cessation -pt to follow up in 1 year.  He is to contact office sooner prn

## 2019-09-05 ENCOUNTER — Other Ambulatory Visit: Payer: Self-pay | Admitting: Physician Assistant

## 2019-09-05 DIAGNOSIS — Z1211 Encounter for screening for malignant neoplasm of colon: Secondary | ICD-10-CM

## 2020-08-31 ENCOUNTER — Ambulatory Visit: Payer: Self-pay | Admitting: Physician Assistant

## 2020-09-06 ENCOUNTER — Encounter: Payer: Self-pay | Admitting: Physician Assistant

## 2020-09-21 ENCOUNTER — Other Ambulatory Visit: Payer: Self-pay

## 2020-09-21 ENCOUNTER — Encounter: Payer: Self-pay | Admitting: Physician Assistant

## 2020-09-21 ENCOUNTER — Other Ambulatory Visit (HOSPITAL_COMMUNITY)
Admission: RE | Admit: 2020-09-21 | Discharge: 2020-09-21 | Disposition: A | Discharge status: Home / Self Care | Payer: Self-pay | Source: Ambulatory Visit | Attending: Physician Assistant | Admitting: Physician Assistant

## 2020-09-21 ENCOUNTER — Ambulatory Visit: Payer: Self-pay | Admitting: Physician Assistant

## 2020-09-21 VITALS — BP 125/74 | HR 86 | Temp 98.2°F

## 2020-09-21 DIAGNOSIS — M545 Low back pain, unspecified: Secondary | ICD-10-CM

## 2020-09-21 DIAGNOSIS — Z125 Encounter for screening for malignant neoplasm of prostate: Secondary | ICD-10-CM | POA: Insufficient documentation

## 2020-09-21 DIAGNOSIS — F17200 Nicotine dependence, unspecified, uncomplicated: Secondary | ICD-10-CM

## 2020-09-21 DIAGNOSIS — Z0000 Encounter for general adult medical examination without abnormal findings: Secondary | ICD-10-CM

## 2020-09-21 DIAGNOSIS — Z1211 Encounter for screening for malignant neoplasm of colon: Secondary | ICD-10-CM

## 2020-09-21 LAB — PSA: Prostatic Specific Antigen: 0.63 ng/mL (ref 0.00–4.00)

## 2020-09-21 MED ORDER — PREDNISONE 10 MG PO TABS: ORAL_TABLET | ORAL | 0 refills | Status: DC
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
## 2021-03-23 ENCOUNTER — Encounter: Payer: Self-pay | Admitting: Emergency Medicine

## 2021-03-23 ENCOUNTER — Ambulatory Visit
Admission: EM | Admit: 2021-03-23 | Discharge: 2021-03-23 | Disposition: A | Discharge status: Home / Self Care | Payer: Self-pay | Attending: Emergency Medicine | Admitting: Emergency Medicine

## 2021-03-23 DIAGNOSIS — M79671 Pain in right foot: Secondary | ICD-10-CM | POA: Diagnosis not present

## 2021-03-23 MED ORDER — DEXAMETHASONE SODIUM PHOSPHATE 10 MG/ML IJ SOLN
10.0000 mg | Freq: Once | INTRAMUSCULAR | Status: AC
  Administered 2021-03-23: 10 mg via INTRAMUSCULAR

## 2021-03-23 MED ORDER — PREDNISONE 10 MG (21) PO TBPK: ORAL_TABLET | Freq: Every day | ORAL | 0 refills | Status: DC

## 2021-03-23 NOTE — Discharge Instructions (Addendum)
Steroid shot given in office Prescribed  prednisone Follow up with PCP for further evaluation and management Return or go to the ED if you have any new or worsening symptoms

## 2021-03-23 NOTE — ED Triage Notes (Signed)
Pain to RT foot x 3 days.  Denies any injury

## 2021-03-23 NOTE — ED Provider Notes (Signed)
[facility]   [id number] 03/23/21 Arrival Time: 1032  MW:NUUVO PAIN  SUBJECTIVE: History from: patient. Seth Carney is a 50 y.o. male complains of RT foot pain x 3 days.  Denies a precipitating event or specific injury.  Localizes the pain to the top of foot.  Describes the pain as intermittent, sharp, dull, and achy in character.  Has tried OTC medications without relief.  Symptoms are made worse with touch and weight-bearing.  Denies similar symptoms in the past.  Complains of associated swelling and numbness/ tingling.  Denies fever, chills, erythema, ecchymosis, effusion, weakness.   ROS: As per HPI.  All other pertinent ROS negative.     Past Medical History:  Diagnosis Date   Kidney stones    Past Surgical History:  Procedure Laterality Date   KNEE SURGERY Left    x6   No Known Allergies No current facility-administered medications on file prior to encounter.   Current Outpatient Medications on File Prior to Encounter  Medication Sig Dispense Refill   Aspirin-Caffeine (BAYER BACK & BODY PO) Take by mouth.     Social History   Socioeconomic History   Marital status: Single    Spouse name: Not on file   Number of children: Not on file   Years of education: Not on file   Highest education level: Not on file  Occupational History   Not on file  Tobacco Use   Smoking status: Every Day    Packs/day: 2.00    Years: 31.00    Pack years: 62.00    Types: Cigarettes   Smokeless tobacco: Never  Vaping Use   Vaping Use: Never used  Substance and Sexual Activity   Alcohol use: No   Drug use: No   Sexual activity: Not on file  Other Topics Concern   Not on file  Social History Narrative   Not on file   Social Determinants of Health   Financial Resource Strain: Not on file  Food Insecurity: Not on file  Transportation Needs: Not on file  Physical Activity: Not on file  Stress: Not on file  Social Connections: Not on file  Intimate Partner Violence:  Not on file   Family History  Problem Relation Age of Onset   COPD Mother    Bipolar disorder Father    Diabetes Father    Cancer Brother     OBJECTIVE:  Vitals:   03/23/21 1146  BP: 105/72  Pulse: 94  Resp: 16  Temp: 98.4 F (36.9 C)  TempSrc: Tympanic  SpO2: 98%    General appearance: ALERT; in no acute distress.  Head: NCAT Lungs: Normal respiratory effort CV: Dorsalis pedis pulse 2+ Musculoskeletal: RT foot Inspection: mild swelling Palpation: exquisitely TTP over proximal dorsal aspect of foot Skin: warm and dry Neurologic: Ambulates with a cane; Sensation intact about the upper/ lower extremities Psychological: alert and cooperative; normal mood and affect   ASSESSMENT & PLAN:  1. Right foot pain      Meds ordered this encounter  Medications   dexamethasone (DECADRON) injection 10 mg   predniSONE (STERAPRED UNI-PAK 21 TAB) 10 MG (21) TBPK tablet    Sig: Take by mouth daily. Take as directed    Dispense:  42 tablet    Refill:  0    Order Specific Question:   Supervising Provider    Answer:   Eustace Moore [[id number]]   Steroid shot given in office Prescribed  prednisone Follow up with PCP for further  evaluation and management Return or go to the ED if you have any new or worsening symptoms   Reviewed expectations re: course of current medical issues. Questions answered. Outlined signs and symptoms indicating need for more acute intervention. Patient verbalized understanding. After Visit Summary given.     Rennis Harding, PA-C 03/23/21 1230

## 2021-04-04 ENCOUNTER — Ambulatory Visit (HOSPITAL_COMMUNITY)
Admission: RE | Admit: 2021-04-04 | Discharge: 2021-04-04 | Disposition: A | Discharge status: Home / Self Care | Payer: Self-pay | Source: Ambulatory Visit | Attending: Physician Assistant | Admitting: Physician Assistant

## 2021-04-04 ENCOUNTER — Other Ambulatory Visit (HOSPITAL_COMMUNITY)
Admission: RE | Admit: 2021-04-04 | Discharge: 2021-04-04 | Disposition: A | Discharge status: Home / Self Care | Payer: Self-pay | Source: Ambulatory Visit | Attending: Physician Assistant | Admitting: Physician Assistant

## 2021-04-04 ENCOUNTER — Ambulatory Visit: Payer: Self-pay | Admitting: Physician Assistant

## 2021-04-04 ENCOUNTER — Encounter: Payer: Self-pay | Admitting: Physician Assistant

## 2021-04-04 ENCOUNTER — Other Ambulatory Visit: Payer: Self-pay

## 2021-04-04 VITALS — BP 109/71 | HR 99 | Temp 98.2°F

## 2021-04-04 DIAGNOSIS — M79671 Pain in right foot: Secondary | ICD-10-CM | POA: Insufficient documentation

## 2021-04-04 LAB — URIC ACID: Uric Acid, Serum: 7.1 mg/dL (ref 3.7–8.6)

## 2021-04-04 MED ORDER — PREDNISONE 10 MG (21) PO TBPK: ORAL_TABLET | ORAL | 0 refills | Status: AC
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
## 2021-09-20 ENCOUNTER — Other Ambulatory Visit: Payer: Self-pay

## 2021-09-20 ENCOUNTER — Encounter: Payer: Self-pay | Admitting: Physician Assistant

## 2021-09-20 ENCOUNTER — Ambulatory Visit: Payer: Self-pay | Admitting: Physician Assistant

## 2021-09-20 ENCOUNTER — Other Ambulatory Visit (HOSPITAL_COMMUNITY)
Admission: RE | Admit: 2021-09-20 | Discharge: 2021-09-20 | Disposition: A | Discharge status: Home / Self Care | Payer: Self-pay | Source: Ambulatory Visit | Attending: Physician Assistant | Admitting: Physician Assistant

## 2021-09-20 VITALS — BP 125/78 | HR 92 | Temp 97.3°F

## 2021-09-20 DIAGNOSIS — Z125 Encounter for screening for malignant neoplasm of prostate: Secondary | ICD-10-CM

## 2021-09-20 DIAGNOSIS — F17200 Nicotine dependence, unspecified, uncomplicated: Secondary | ICD-10-CM

## 2021-09-20 DIAGNOSIS — Z1211 Encounter for screening for malignant neoplasm of colon: Secondary | ICD-10-CM

## 2021-09-20 DIAGNOSIS — R062 Wheezing: Secondary | ICD-10-CM

## 2021-09-20 DIAGNOSIS — Z0000 Encounter for general adult medical examination without abnormal findings: Secondary | ICD-10-CM

## 2021-09-20 LAB — PSA: Prostatic Specific Antigen: 0.81 ng/mL (ref 0.00–4.00)

## 2021-09-20 NOTE — Progress Notes (Signed)
BP 125/78    Pulse 92    Temp (!) 97.3 F (36.3 C)    SpO2 96%    Subjective:    Patient ID: Seth Carney, male    DOB: 09-20-1970, 51 y.o.   MRN: [id number]  HPI: Seth Carney is a 52 y.o. male presenting on 09/20/2021 for Annual Exam   HPI   Pt is 50yoM who presents for annual exam.  Pt says he Coughs a lot. 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    He continues to smoke.    He has not gotten the covid vaccination.      Relevant past medical, surgical, family and social history reviewed and updated as indicated. Interim medical history since our last visit reviewed. Allergies and medications reviewed and updated.   Current Outpatient Medications:    Pseudoeph-Doxylamine-DM-APAP (NYQUIL PO), Take by mouth., Disp: , Rfl:    Review of Systems  Per HPI unless specifically indicated above     Objective:    BP 125/78    Pulse 92    Temp (!) 97.3 F (36.3 C)    SpO2 96%   Wt Readings from Last 3 Encounters:  09/02/19 177 lb 8 oz (80.5 kg)  11/04/18 165 lb (74.8 kg)  09/03/18 169 lb (76.7 kg)    Physical Exam Vitals reviewed.  Constitutional:      General: He is not in acute distress.    Appearance: He is well-developed. He is not ill-appearing.     Comments: Pt appears older than stated age  HENT:     Head: Normocephalic and atraumatic.     Right Ear: Tympanic membrane, ear canal and external ear normal.     Left Ear: Tympanic membrane, ear canal and external ear normal.  Eyes:     Extraocular Movements: Extraocular movements intact.     Conjunctiva/sclera: Conjunctivae normal.     Pupils: Pupils are equal, round, and reactive to light.  Neck:     Thyroid: No thyromegaly.  Cardiovascular:     Rate and Rhythm: Normal rate and regular rhythm.  Pulmonary:     Effort: Pulmonary effort is normal.     Breath sounds: No stridor. Wheezing present. No rhonchi or rales.  Abdominal:     General: Bowel  sounds are normal.     Palpations: Abdomen is soft. There is no mass.     Tenderness: There is no abdominal tenderness.  Musculoskeletal:     Cervical back: Neck supple.     Right lower leg: No edema.     Left lower leg: No edema.  Lymphadenopathy:     Cervical: No cervical adenopathy.  Skin:    General: Skin is warm and dry.     Findings: No rash.  Neurological:     Mental Status: He is alert and oriented to person, place, and time.     Motor: No weakness or tremor.     Gait: Gait is intact.     Deep Tendon Reflexes:     Reflex Scores:      Patellar reflexes are 2+ on the right side and 2+ on the left side. Psychiatric:        Behavior: Behavior normal.          Assessment & Plan:   Encounter Diagnoses  Name Primary?   Encounter for annual physical exam Yes   Tobacco use disorder  Wheezing    Screening for prostate cancer    Screening for colon cancer      -pt is educated and encouraged to get Covid vaccination -counseled Smoking cessation -pt is given FIT test  for colon cancer screening -will check PSA  -pt is given sample Albuterol MDI to use prn wheezing/cough.  He will notify office if he wants rx sent to [facility] -pt to follow up1 year.  He is to contact office sooner prn

## 2021-09-20 NOTE — Patient Instructions (Signed)
COVID-19 vaccination significantly lowers your risk of severe illness, hospitalization, and death if you get infected. Compared to people who are up to date with their COVID-19 vaccinations, unvaccinated people aremore likely to get COVID-19, much more likely to be hospitalized with COVID-19, and much more likely to die from COVID-19. ?Like all vaccines, COVID-19 vaccines are not 100% effective at preventing infection. Some people who are up to date with their COVID-19 vaccinations will get COVID-19 breakthrough infection. However, staying up to date with your COVID-19 vaccinations means that you are less likely to have a breakthrough infection and, if you do get sick, you are less likely to get severely ill or die. Staying up to date with COVID-19 vaccination also means you are less likely to spread the disease to others and increases your protection against new variants of SARS-CoV-2, the virus that causes COVID-19. ? ?

## 2021-09-21 ENCOUNTER — Other Ambulatory Visit: Payer: Self-pay | Admitting: Physician Assistant

## 2021-09-21 DIAGNOSIS — Z1211 Encounter for screening for malignant neoplasm of colon: Secondary | ICD-10-CM

## 2021-09-21 IMAGING — DX DG FOOT COMPLETE 3+V*R*
3 series · 3 of 3 positions shown · non-contrast
Comparison: None.

CLINICAL DATA: Atraumatic right foot pain.

EXAM:
RIGHT FOOT COMPLETE - 3+ VIEW

[foot ap]
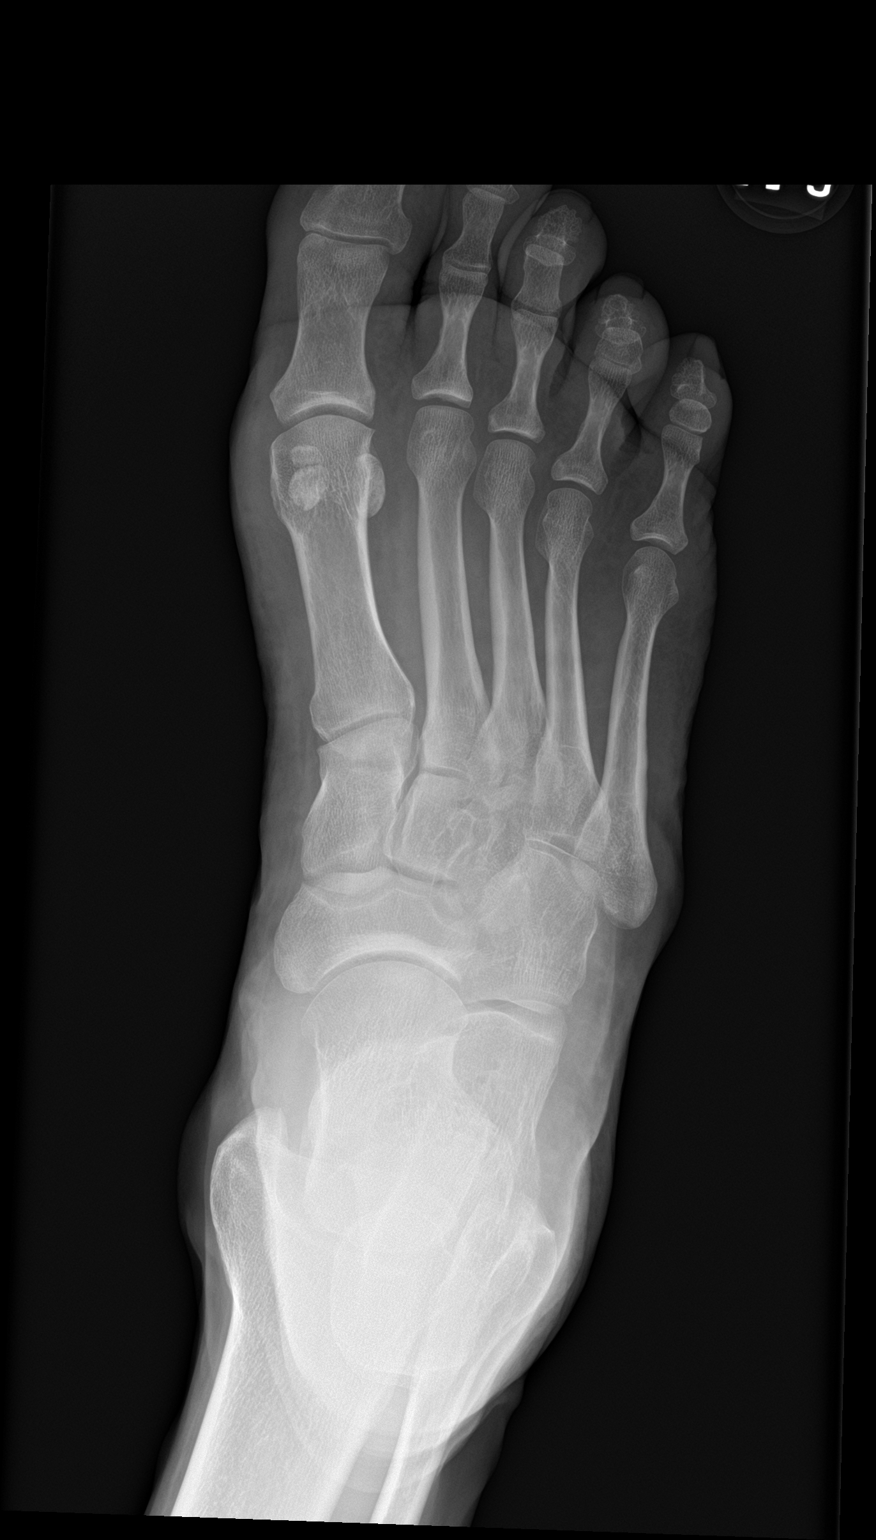

[foot obl]
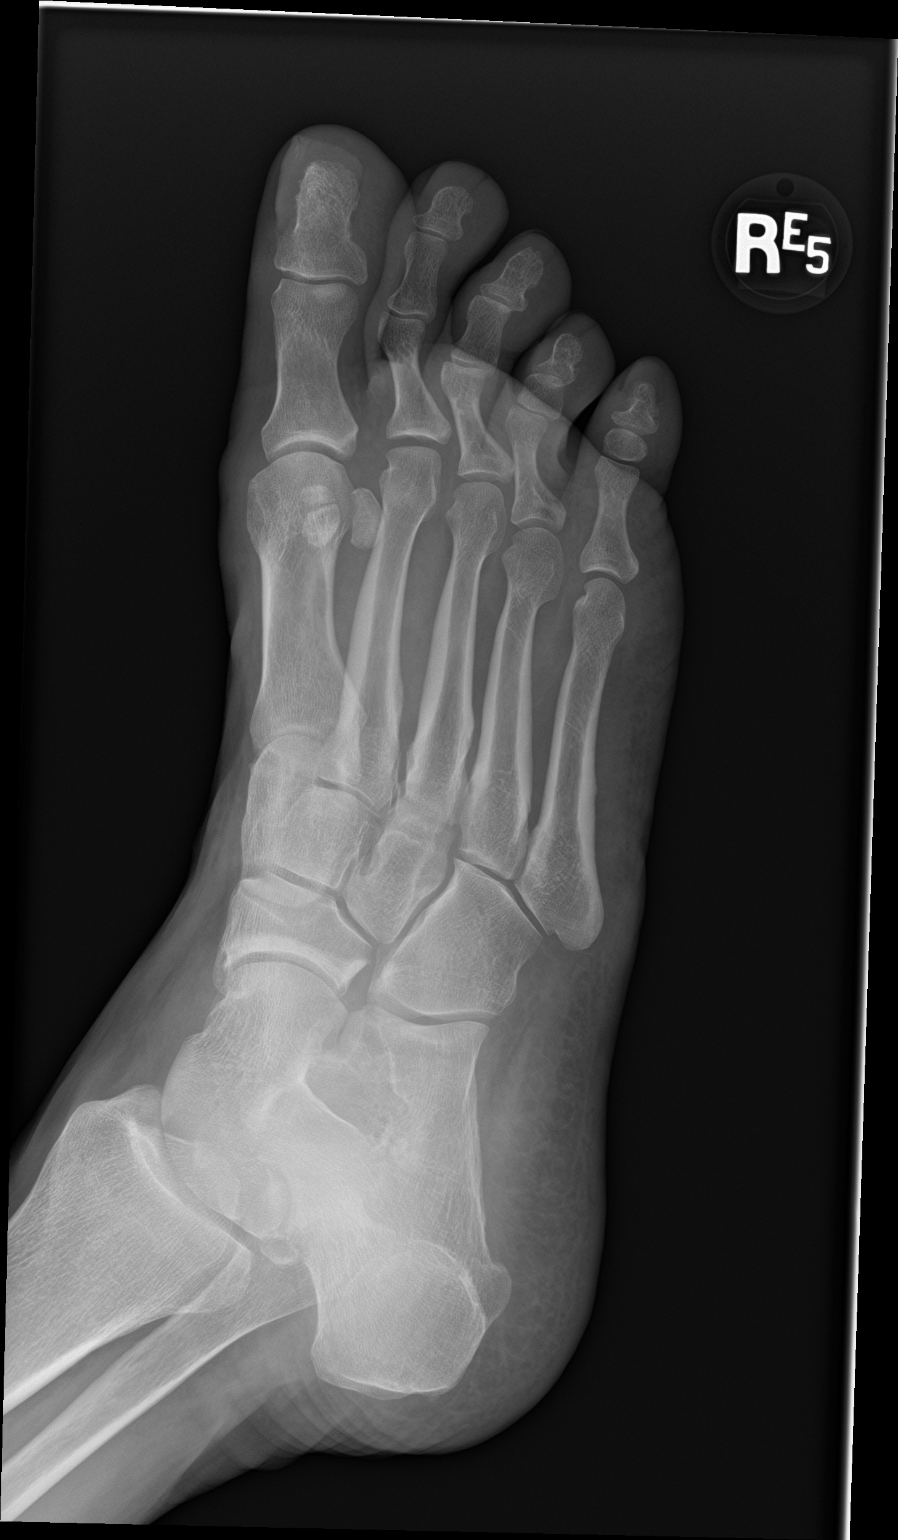

[foot lat]
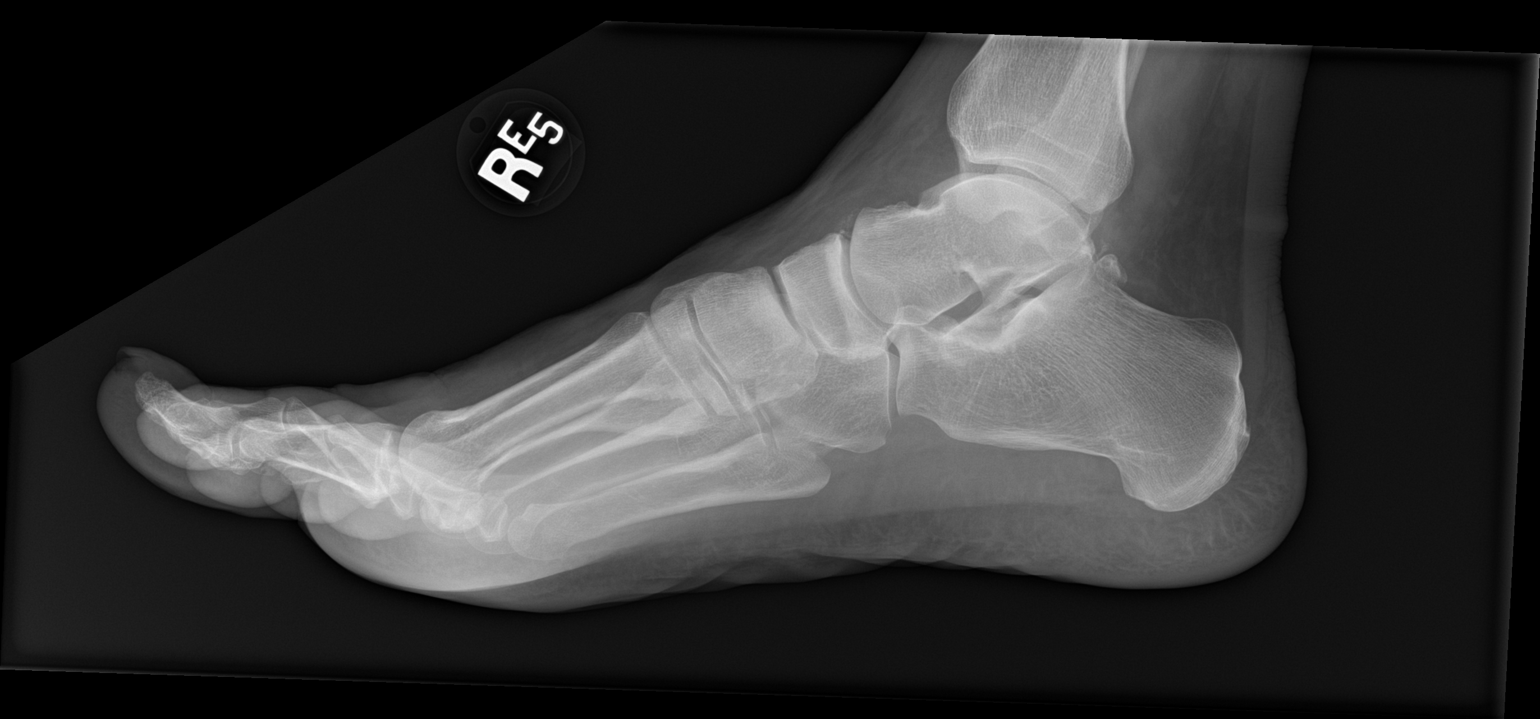

[3 of 3 positions shown; findings below may reference images not displayed]

FINDINGS: There is no evidence of fracture or dislocation. There is no
evidence of arthropathy or other focal bone abnormality. Mild soft
tissue swelling is seen adjacent to the medial aspect of the first
metatarsophalangeal joint.
IMPRESSION: No acute osseous abnormality.

## 2022-04-07 ENCOUNTER — Encounter (HOSPITAL_COMMUNITY): Payer: Self-pay | Admitting: *Deleted

## 2022-04-07 ENCOUNTER — Emergency Department (HOSPITAL_COMMUNITY): Payer: Self-pay

## 2022-04-07 ENCOUNTER — Other Ambulatory Visit: Payer: Self-pay

## 2022-04-07 ENCOUNTER — Emergency Department (HOSPITAL_COMMUNITY)
Admission: EM | Admit: 2022-04-07 | Discharge: 2022-04-07 | Disposition: A | Discharge status: Home / Self Care | Payer: Self-pay | Attending: Emergency Medicine | Admitting: Emergency Medicine

## 2022-04-07 DIAGNOSIS — S92534A Nondisplaced fracture of distal phalanx of right lesser toe(s), initial encounter for closed fracture: Secondary | ICD-10-CM | POA: Insufficient documentation

## 2022-04-07 DIAGNOSIS — W208XXA Other cause of strike by thrown, projected or falling object, initial encounter: Secondary | ICD-10-CM | POA: Insufficient documentation

## 2022-04-07 MED ORDER — HYDROCODONE-ACETAMINOPHEN 5-325 MG PO TABS: 1.0000 | ORAL_TABLET | Freq: Four times a day (QID) | ORAL | 0 refills | Status: DC | PRN
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
## 2022-04-07 NOTE — ED Provider Notes (Signed)
[facility] EMERGENCY DEPARTMENT Provider Note   CSN: [id number] Arrival date & time: 04/07/22  1035     History  Chief Complaint  Patient presents with   Toe Injury    Seth Carney is a 51 y.o. male with no significant past medical history presenting for evaluation of swelling, bruising and significant pain of his right third toe.  He had a significant injury yesterday, dropping a heavy impact gun on it.  He was wearing a shoe when the injury occurred, he initially noted immediate bruising and little bit of swelling at the site but the swelling has worsened overnight and now is developed a blister on the dorsum of the toe.  He denies foot or ankle pain, pain is localized to the middle toe.  He has used ice without much improvement in his symptoms.  The history is provided by the patient.       Home Medications Prior to Admission medications   Medication Sig Start Date End Date Taking? Authorizing Provider  Pseudoeph-Doxylamine-DM-APAP (NYQUIL PO) Take by mouth.    [provider]      Allergies    Patient has no known allergies.    Review of Systems   Review of Systems  Constitutional:  Negative for fever.  Musculoskeletal:  Positive for arthralgias and joint swelling. Negative for myalgias.  Skin:  Positive for color change.  Neurological:  Negative for weakness and numbness.    Physical Exam Updated Vital Signs BP (!) 150/90 (BP Location: Right Arm)   Pulse 82   Temp 97.9 F (36.6 C) (Oral)   Resp 16   Ht 6\' 2"  (1.88 m)   Wt 79.4 kg   SpO2 96%   BMI 22.47 kg/m  Physical Exam Constitutional:      Appearance: He is well-developed.  HENT:     Head: Atraumatic.  Cardiovascular:     Comments: Pulses equal bilaterally Musculoskeletal:        General: Tenderness present.     Cervical back: Normal range of motion.     Right foot: Normal capillary refill. Swelling and bony tenderness present. Normal pulse.     Comments: Significant swelling and  bruising of the right third toe, intact dorsal blister distal toe.  Distal sensation is intact.  No lacerations or open wounds.  Skin:    General: Skin is warm and dry.  Neurological:     Mental Status: He is alert.     Sensory: No sensory deficit.     Motor: No weakness.     Deep Tendon Reflexes: Reflexes normal.     ED Results / Procedures / Treatments   Labs (all labs ordered are listed, but only abnormal results are displayed) Labs Reviewed - No data to display  EKG None  Radiology DG Foot Complete Right  Result Date: 04/07/2022 CLINICAL DATA:  Dropped air gun on right third toe. EXAM: RIGHT FOOT COMPLETE - 3+ VIEW COMPARISON:  None Available. FINDINGS: There is an acute fracture involving the third distal phalanx at the base of the tuft. The fracture fragments appear to be in near anatomic alignment. No signs of dislocation. No additional fracture. IMPRESSION: Acute fracture involves the base of the tuft of the third distal phalanx. Electronically Signed   By: 04/09/2022 M.D.   On: 04/07/2022 11:38    Procedures Procedures    Medications Ordered in ED Medications - No data to display  ED Course/ Medical Decision Making/ A&P  Medical Decision Making Patient with a nondisplaced distal phalanx fracture right middle toe.  Significant bruising and an intact blister at the site.  Crush injury.  Distal sensation is intact.  Buddy taping, postop shoe, close follow-up with orthopedics, referral was given to Dr. Constance Goltz.  Amount and/or Complexity of Data Reviewed Radiology: ordered.    Details: Reviewed per above.           Final Clinical Impression(s) / ED Diagnoses Final diagnoses:  Closed nondisplaced fracture of distal phalanx of lesser toe of right foot, initial encounter    Rx / DC Orders ED Discharge Orders     None         Victoriano Lain 04/07/22 1204    Gerhard Munch, MD 04/07/22 1432

## 2022-04-07 NOTE — Discharge Instructions (Signed)
Wear the postop shoe at all times when standing and walking to protect your fractured toe.  Continue to use buddy tape as the splint, you will probably need to change this daily to a fresh tape.  The blister will eventually pop, but I recommend keeping it intact as long as possible.  Once it does start draining make sure you are keeping the site clean and dry, I recommend warm soapy wash twice daily followed by an antibiotic ointment of choice (like Neosporin) followed by a new dressing.

## 2022-04-08 ENCOUNTER — Encounter: Payer: Self-pay | Admitting: Physician Assistant

## 2022-04-08 ENCOUNTER — Ambulatory Visit: Payer: Self-pay | Admitting: Physician Assistant

## 2022-04-08 ENCOUNTER — Telehealth: Payer: Self-pay

## 2022-04-08 VITALS — BP 116/76 | HR 82 | Temp 98.0°F

## 2022-04-08 DIAGNOSIS — S92534D Nondisplaced fracture of distal phalanx of right lesser toe(s), subsequent encounter for fracture with routine healing: Secondary | ICD-10-CM

## 2022-04-08 NOTE — Progress Notes (Signed)
   BP 116/76   Pulse 82   Temp 98 F (36.7 C)   SpO2 99%    Subjective:    Patient ID: Seth Carney, male    DOB: [birth year], 51 y.o.   MRN: [id number]  HPI: Seth Carney is a 51 y.o. male presenting on 04/08/2022 for toe fracture   HPI  Pt was seen in ER yesterday.  An Air gun fell on toe Saturday while at work.  [facility] [place])  He had xrays done that showed a tuft fx right 3rd toe.  He was given post-op shoe in ER and rx vicodin.    Pt is concerned about what he says he needs to do.  He feels uncertain about instructions given to him in ER.     Relevant past medical, surgical, family and social history reviewed and updated as indicated. Interim medical history since our last visit reviewed. Allergies and medications reviewed and updated.    Current Outpatient Medications:    HYDROcodone-acetaminophen (NORCO/VICODIN) 5-325 MG tablet, Take 1 tablet by mouth every 6 (six) hours as needed., Disp: 15 tablet, Rfl: 0    Review of Systems  Per HPI unless specifically indicated above     Objective:    BP 116/76   Pulse 82   Temp 98 F (36.7 C)   SpO2 99%   Wt Readings from Last 3 Encounters:  04/07/22 175 lb (79.4 kg)  09/02/19 177 lb 8 oz (80.5 kg)  11/04/18 165 lb (74.8 kg)    Physical Exam Constitutional:      General: He is not in acute distress.    Appearance: He is not toxic-appearing.  HENT:     Head: Normocephalic and atraumatic.  Pulmonary:     Effort: Pulmonary effort is normal. No respiratory distress.  Musculoskeletal:     Right foot: Tenderness present.     Comments: Large blister on right 3rd toe.  No drainage.   + sensation.    Skin:    General: Skin is warm and dry.  Neurological:     Mental Status: He is alert and oriented to person, place, and time.  Psychiatric:        Behavior: Behavior normal.           Assessment & Plan:    Encounter Diagnosis  Name Primary?   Closed nondisplaced fracture of distal phalanx of lesser toe of  right foot with routine healing, subsequent encounter Yes     -showed pt xray of his toe -Pt is encouraged to use his post-op shoe.  He is to elevate the foot.  Apply ice 10-20 minutes several times daily.   Discussed buddy tape for support but this may be uncomfortable at present in light of his blister.    -pt is encouraged to leave blister alone and do not intentionally try to bust it.  He is counseled to avoid applying H2O2, alcohol, bleach or any other cleaners besides plain soap and water -Refer to [facility] for follow up.  He does not want to go to [facility] if possible (name of ortho given to him yesterday) -pt encouraged to update enrollment with [facility].  He is given application for [employer] financial assistance but he is told that this injury is workers comp and his employer should pay for his medical care -pt is given note to be out of work this week -pt to follow up here as scheduled

## 2022-04-08 NOTE — Telephone Encounter (Signed)
followed up with patient by phone s/p visit the ED for severe bruising and fracture of toe on right foot that occured from dropping heavy impact gun equipment on his foot while working.      [facility] and was provided with instructions by the provider on proper care of his toe injury until his referral with an orthopaedic can be completed.    Pt  renewed [facility] enrollment/apps, med assist application, [employer] financial app and began his [employer] financial assistance app and process that will be completed once returning all documents required for [employer] submision which is scheduled for Tues, 8.22.23 at [facility] office  He states he has all meds and currently has no additonal medical related questions/concerns, but is hoping to be able to move forward with seeing an orthopeadic provider soon   Call ended

## 2022-04-16 ENCOUNTER — Encounter: Payer: Self-pay | Admitting: Orthopedic Surgery

## 2022-04-16 ENCOUNTER — Ambulatory Visit (INDEPENDENT_AMBULATORY_CARE_PROVIDER_SITE_OTHER): Payer: 59 | Admitting: Orthopedic Surgery

## 2022-04-16 VITALS — Ht 74.0 in | Wt 175.0 lb

## 2022-04-16 DIAGNOSIS — S92534A Nondisplaced fracture of distal phalanx of right lesser toe(s), initial encounter for closed fracture: Secondary | ICD-10-CM | POA: Diagnosis not present

## 2022-04-16 MED ORDER — PREDNISONE 10 MG (21) PO TBPK: ORAL_TABLET | ORAL | 0 refills | Status: DC

## 2022-04-17 NOTE — Progress Notes (Signed)
New Patient Visit  Assessment: Seth Carney is a 51 y.o. male with the following: 1. Closed nondisplaced fracture of distal phalanx of lesser toe of right foot, initial encounter  Plan: Seth Carney sustained a minimally displaced fracture of the distal phalanx to his middle toe on the right foot.  Swelling and blistering has significantly improved.  His pain has gotten better.  He is wearing a regular shoe.  Ok to continue with WBAT through the heel, limited forefoot WB.  Follow-up: Return in about 4 weeks (around 05/14/2022).  Subjective:  Chief Complaint  Patient presents with   Fracture    Rt middle toe     History of Present Illness: Seth Carney is a 51 y.o. male who presents for evaluation of left foot pain.  Almost 2 weeks ago he dropped a heavy item directly on to his toes.  He has immediate pain.  He noticed bruising.  He presented to the ED and radiographs demonstrate a minimally displaced fracture of the distal phalanx to his lesser toe.  He developed a lot of swelling and blistering.  This has improved.  He is wearing a regular shoe.  He also has issues with his left knee, including multiple procedures in the past.  He states his left knee is swelling more since the injury on his right foot.  Review of Systems: No fevers or chills No numbness or tingling No chest pain No shortness of breath No bowel or bladder dysfunction No GI distress No headaches   Medical History:  Past Medical History:  Diagnosis Date   Kidney stones     Past Surgical History:  Procedure Laterality Date   KNEE SURGERY Left    x6    Family History  Problem Relation Age of Onset   COPD Mother    Bipolar disorder Father    Diabetes Father    Cancer Brother    Social History   Tobacco Use   Smoking status: Every Day    Packs/day: 2.00    Years: 31.00    Total pack years: 62.00    Types: Cigarettes   Smokeless tobacco: Never  Vaping Use   Vaping Use: Never used   Substance Use Topics   Alcohol use: No   Drug use: No    No Known Allergies  Current Meds  Medication Sig   predniSONE (STERAPRED UNI-PAK 21 TAB) 10 MG (21) TBPK tablet 10 mg DS 12 as directed    Objective: Ht 6\' 2"  (1.88 m)   Wt 175 lb (79.4 kg)   BMI 22.47 kg/m   Physical Exam:  General: Alert and oriented. and No acute distress. Gait: Right-sided antalgic gait  Right foot with diffuse swelling of the lesser toe.  Healing blistering.  Toes warm well perfused.  No additional injuries noted.  No drainage.  IMAGING: I personally reviewed images previously obtained from the ED  Minimally displaced fracture of the distal phalanx to the third toe.   New Medications:  Meds ordered this encounter  Medications   predniSONE (STERAPRED UNI-PAK 21 TAB) 10 MG (21) TBPK tablet    Sig: 10 mg DS 12 as directed    Dispense:  48 tablet    Refill:  0      , MD  04/17/2022 10:09 AM

## 2022-05-14 ENCOUNTER — Ambulatory Visit: Payer: Self-pay | Admitting: Orthopedic Surgery

## 2022-09-19 ENCOUNTER — Ambulatory Visit: Payer: Self-pay | Admitting: Physician Assistant

## 2022-10-17 ENCOUNTER — Encounter: Payer: Self-pay | Admitting: Radiology

## 2023-02-06 ENCOUNTER — Telehealth: Payer: Self-pay

## 2023-02-06 NOTE — Telephone Encounter (Signed)
Attempted to return call, no answer and unable to leave voicemail as mailbox is full.        Francee Nodal RN  [employer]

## 2023-03-31 ENCOUNTER — Encounter (INDEPENDENT_AMBULATORY_CARE_PROVIDER_SITE_OTHER): Payer: Self-pay | Admitting: *Deleted

## 2023-04-10 ENCOUNTER — Ambulatory Visit
Admission: EM | Admit: 2023-04-10 | Discharge: 2023-04-10 | Disposition: A | Discharge status: Home / Self Care | Payer: Medicaid Other | Attending: Nurse Practitioner | Admitting: Nurse Practitioner

## 2023-04-10 ENCOUNTER — Encounter: Payer: Self-pay | Admitting: Emergency Medicine

## 2023-04-10 DIAGNOSIS — M79671 Pain in right foot: Secondary | ICD-10-CM | POA: Diagnosis not present

## 2023-04-10 MED ORDER — KETOROLAC TROMETHAMINE 30 MG/ML IJ SOLN
30.0000 mg | Freq: Once | INTRAMUSCULAR | Status: AC
  Administered 2023-04-10: 30 mg via INTRAMUSCULAR

## 2023-04-10 NOTE — ED Triage Notes (Signed)
Right foot pain and swelling x 1 week.  States joints on right side of body have been bothering him.  Has been soaking foot in epsom salt.   Non known injury.

## 2023-04-10 NOTE — Discharge Instructions (Signed)
We have given you an injection of an anti-inflammatory medicine called Toradol today to help with pain.  We have also put your foot in an Ace wrap, wear this when you are up walking on your foot.  In addition, keep your foot elevated, apply ice 15 minutes on, 45 minutes off every hour while awake.  You can take Tylenol 100 to 1000 mg every 6 hours.  Since we gave you the anti-inflammatory medication today, do not take any other NSAIDs for the next 48 hours, however after that you can take ibuprofen 400 mg every 8 hours for pain.  Follow-up with podiatry with persistent/worsening symptoms despite treatment.  Contact information has been provided.

## 2023-04-10 NOTE — ED Provider Notes (Signed)
[facility]    CSN: [id number] Arrival date & time: 04/10/23  1058      History   Chief Complaint Chief Complaint  Patient presents with   right foot pain    HPI Seth Carney is a 52 y.o. male.   Patient presents today with 1 week history of right dorsal foot pain.  Reports initially, the foot was swollen but this has improved significantly.  He has been soaking his foot in Epsom salt and applying ice with some improvement.  Reports last week, he tripped over a hose and noticed the swelling shortly thereafter.  Otherwise, no known injury to the foot.  No bruising, redness, fever, nausea/vomiting.  Reports elevating it does not help and he feels the pain constantly.  Pain is worse with walking.    Past Medical History:  Diagnosis Date   Kidney stones     There are no problems to display for this patient.   Past Surgical History:  Procedure Laterality Date   KNEE SURGERY Left    x6       Home Medications    Prior to Admission medications   Not on File    Family History Family History  Problem Relation Age of Onset   COPD Mother    Bipolar disorder Father    Diabetes Father    Cancer Brother     Social History Social History   Tobacco Use   Smoking status: Every Day    Current packs/day: 2.00    Average packs/day: 2.0 packs/day for 31.0 years (62.0 ttl pk-yrs)    Types: Cigarettes   Smokeless tobacco: Never  Vaping Use   Vaping status: Never Used  Substance Use Topics   Alcohol use: No   Drug use: No     Allergies   Patient has no known allergies.   Review of Systems Review of Systems Per HPI  Physical Exam Triage Vital Signs ED Triage Vitals  Encounter Vitals Group     BP 04/10/23 1151 109/74     Systolic BP Percentile --      Diastolic BP Percentile --      Pulse Rate 04/10/23 1151 85     Resp 04/10/23 1151 18     Temp 04/10/23 1151 98.5 F (36.9 C)     Temp Source 04/10/23 1151 Oral     SpO2 04/10/23 1151 95 %      Weight --      Height --      Head Circumference --      Peak Flow --      Pain Score 04/10/23 1153 10     Pain Loc --      Pain Education --      Exclude from Growth Chart --    No data found.  Updated Vital Signs BP 109/74 (BP Location: Right Arm)   Pulse 85   Temp 98.5 F (36.9 C) (Oral)   Resp 18   SpO2 95%   Visual Acuity Right Eye Distance:   Left Eye Distance:   Bilateral Distance:    Right Eye Near:   Left Eye Near:    Bilateral Near:     Physical Exam Vitals and nursing note reviewed.  Constitutional:      General: He is not in acute distress.    Appearance: Normal appearance. He is not toxic-appearing.  Pulmonary:     Effort: Pulmonary effort is normal. No respiratory distress.  Musculoskeletal:  Feet:     Comments: Inspection: no swelling, bruising, obvious deformity or redness to dorsal foot Palpation: tender to palpation to the base of the 3rd through 5th metatarsals and approximately area marked; no obvious deformities palpated ROM: Full ROM to right foot and ankle Strength: 5/5 right lower extremity Neurovascular: neurovascularly intact in right lower extremity  Skin:    General: Skin is warm and dry.     Capillary Refill: Capillary refill takes less than 2 seconds.     Coloration: Skin is not jaundiced or pale.     Findings: No erythema.  Neurological:     Mental Status: He is alert and oriented to person, place, and time.  Psychiatric:        Behavior: Behavior is cooperative.      UC Treatments / Results  Labs (all labs ordered are listed, but only abnormal results are displayed) Labs Reviewed - No data to display  EKG   Radiology No results found.  Procedures Procedures (including critical care time)  Medications Ordered in UC Medications  ketorolac (TORADOL) 30 MG/ML injection 30 mg (30 mg Intramuscular Given 04/10/23 1239)    Initial Impression / Assessment and Plan / UC Course  I have reviewed the triage vital  signs and the nursing notes.  Pertinent labs & imaging results that were available during my care of the patient were reviewed by me and considered in my medical decision making (see chart for details).   Patient is well-appearing, normotensive, afebrile, not tachycardic, not tachypneic, oxygenating well on room air.    1. Right foot pain Unclear etiology, suspect possible foot sprain X-ray imaging deferred given no known injury or trauma Will treat pain with Ace wrap, Toradol, rest, ice, compression, elevation Start Tylenol 500 to 1000 mg every 6 hours for pain Recommended follow-up with podiatry if symptoms persist or worsen despite treatment  The patient was given the opportunity to ask questions.  All questions answered to their satisfaction.  The patient is in agreement to this plan.    Final Clinical Impressions(s) / UC Diagnoses   Final diagnoses:  Right foot pain     Discharge Instructions      We have given you an injection of an anti-inflammatory medicine called Toradol today to help with pain.  We have also put your foot in an Ace wrap, wear this when you are up walking on your foot.  In addition, keep your foot elevated, apply ice 15 minutes on, 45 minutes off every hour while awake.  You can take Tylenol 100 to 1000 mg every 6 hours.  Since we gave you the anti-inflammatory medication today, do not take any other NSAIDs for the next 48 hours, however after that you can take ibuprofen 400 mg every 8 hours for pain.  Follow-up with podiatry with persistent/worsening symptoms despite treatment.  Contact information has been provided.   ED Prescriptions   None    PDMP not reviewed this encounter.   Valentino Nose, NP 04/10/23 [phone]

## 2023-10-02 ENCOUNTER — Encounter (INDEPENDENT_AMBULATORY_CARE_PROVIDER_SITE_OTHER): Payer: Self-pay | Admitting: *Deleted
